# Patient Record
Sex: Female | Born: 1990 | Race: Black or African American | Hispanic: No | Marital: Single | Smoking: Never smoker
Health system: Southern US, Community
[De-identification: ages and names within clinical notes are randomized; demographics above are authoritative.]

---

## 2014-04-05 ENCOUNTER — Emergency Department (HOSPITAL_COMMUNITY)
Admission: EM | Admit: 2014-04-05 | Discharge: 2014-04-05 | Disposition: A | Discharge status: Home / Self Care | Payer: No Typology Code available for payment source | Attending: Emergency Medicine | Admitting: Emergency Medicine

## 2014-04-05 ENCOUNTER — Encounter (HOSPITAL_COMMUNITY): Payer: Self-pay | Admitting: Emergency Medicine

## 2014-04-05 DIAGNOSIS — S39012A Strain of muscle, fascia and tendon of lower back, initial encounter: Secondary | ICD-10-CM

## 2014-04-05 DIAGNOSIS — S335XXA Sprain of ligaments of lumbar spine, initial encounter: Secondary | ICD-10-CM | POA: Insufficient documentation

## 2014-04-05 DIAGNOSIS — Y9389 Activity, other specified: Secondary | ICD-10-CM | POA: Insufficient documentation

## 2014-04-05 DIAGNOSIS — Y92410 Unspecified street and highway as the place of occurrence of the external cause: Secondary | ICD-10-CM | POA: Insufficient documentation

## 2014-04-05 DIAGNOSIS — IMO0002 Reserved for concepts with insufficient information to code with codable children: Secondary | ICD-10-CM | POA: Diagnosis present

## 2014-04-05 MED ORDER — IBUPROFEN 600 MG PO TABS: 600.0000 mg | ORAL_TABLET | Freq: Four times a day (QID) | ORAL | Status: DC | PRN
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
## 2014-04-05 NOTE — Discharge Instructions (Signed)
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
## 2014-04-05 NOTE — ED Provider Notes (Signed)
Medical screening examination/treatment/procedure(s) were performed by non-physician practitioner and as supervising physician I was immediately available for consultation/collaboration.  Hara Milholland L Misheel Gowans, MD 04/05/14 2318 

## 2014-06-11 ENCOUNTER — Encounter (HOSPITAL_COMMUNITY): Payer: Self-pay | Admitting: *Deleted

## 2014-06-11 ENCOUNTER — Emergency Department (HOSPITAL_COMMUNITY): Payer: 59

## 2014-06-11 ENCOUNTER — Emergency Department (HOSPITAL_COMMUNITY)
Admission: EM | Admit: 2014-06-11 | Discharge: 2014-06-11 | Disposition: A | Discharge status: Home / Self Care | Payer: 59 | Attending: Emergency Medicine | Admitting: Emergency Medicine

## 2014-06-11 ENCOUNTER — Emergency Department (HOSPITAL_COMMUNITY): Admission: EM | Admit: 2014-06-11 | Payer: No Typology Code available for payment source | Source: Home / Self Care

## 2014-06-11 DIAGNOSIS — B9789 Other viral agents as the cause of diseases classified elsewhere: Secondary | ICD-10-CM

## 2014-06-11 DIAGNOSIS — R05 Cough: Secondary | ICD-10-CM | POA: Diagnosis present

## 2014-06-11 DIAGNOSIS — R110 Nausea: Secondary | ICD-10-CM | POA: Insufficient documentation

## 2014-06-11 DIAGNOSIS — J069 Acute upper respiratory infection, unspecified: Secondary | ICD-10-CM | POA: Diagnosis not present

## 2014-06-11 DIAGNOSIS — R059 Cough, unspecified: Secondary | ICD-10-CM

## 2014-06-11 MED ORDER — PREDNISONE 20 MG PO TABS: 40.0000 mg | ORAL_TABLET | Freq: Every day | ORAL | Status: AC

## 2014-06-11 MED ORDER — IBUPROFEN 600 MG PO TABS: 600.0000 mg | ORAL_TABLET | Freq: Four times a day (QID) | ORAL | Status: AC | PRN

## 2014-06-11 MED ORDER — BENZONATATE 100 MG PO CAPS: 100.0000 mg | ORAL_CAPSULE | Freq: Three times a day (TID) | ORAL | Status: AC

## 2014-06-11 MED ORDER — FLUTICASONE PROPIONATE 50 MCG/ACT NA SUSP: 2.0000 | Freq: Every day | NASAL | Status: AC

## 2014-06-11 NOTE — ED Notes (Signed)
Pt in c/o cough and congestion for the last several months, recently symptoms have worsened, states she was seen for this a few days ago and dx with bronchitis but they didn't do an xray, pt concerned there is more going on, no distress noted

## 2014-06-11 NOTE — Discharge Instructions (Signed)
Upper Respiratory Infection, Adult °An upper respiratory infection (URI) is also sometimes known as the common cold. The upper respiratory tract includes the nose, sinuses, throat, trachea, and bronchi. Bronchi are the airways leading to the lungs. Most people improve within 1 week, but symptoms can last up to 2 weeks. A residual cough may last even longer.  °CAUSES °Many different viruses can infect the tissues lining the upper respiratory tract. The tissues become irritated and inflamed and often become very moist. Mucus production is also common. A cold is contagious. You can easily spread the virus to others by oral contact. This includes kissing, sharing a glass, coughing, or sneezing. Touching your mouth or nose and then touching a surface, which is then touched by another person, can also spread the virus. °SYMPTOMS  °Symptoms typically develop 1 to 3 days after you come in contact with a cold virus. Symptoms vary from person to person. They may include: °· Runny nose. °· Sneezing. °· Nasal congestion. °· Sinus irritation. °· Sore throat. °· Loss of voice (laryngitis). °· Cough. °· Fatigue. °· Muscle aches. °· Loss of appetite. °· Headache. °· Low-grade fever. °DIAGNOSIS  °You might diagnose your own cold based on familiar symptoms, since most people get a cold 2 to 3 times a year. Your caregiver can confirm this based on your exam. Most importantly, your caregiver can check that your symptoms are not due to another disease such as strep throat, sinusitis, pneumonia, asthma, or epiglottitis. Blood tests, throat tests, and X-rays are not necessary to diagnose a common cold, but they may sometimes be helpful in excluding other more serious diseases. Your caregiver will decide if any further tests are required. °RISKS AND COMPLICATIONS  °You may be at risk for a more severe case of the common cold if you smoke cigarettes, have chronic heart disease (such as heart failure) or lung disease (such as asthma), or if  you have a weakened immune system. The very young and very old are also at risk for more serious infections. Bacterial sinusitis, middle ear infections, and bacterial pneumonia can complicate the common cold. The common cold can worsen asthma and chronic obstructive pulmonary disease (COPD). Sometimes, these complications can require emergency medical care and may be life-threatening. °PREVENTION  °The best way to protect against getting a cold is to practice good hygiene. Avoid oral or hand contact with people with cold symptoms. Wash your hands often if contact occurs. There is no clear evidence that vitamin C, vitamin E, echinacea, or exercise reduces the chance of developing a cold. However, it is always recommended to get plenty of rest and practice good nutrition. °TREATMENT  °Treatment is directed at relieving symptoms. There is no cure. Antibiotics are not effective, because the infection is caused by a virus, not by bacteria. Treatment may include: °· Increased fluid intake. Sports drinks offer valuable electrolytes, sugars, and fluids. °· Breathing heated mist or steam (vaporizer or shower). °· Eating chicken soup or other clear broths, and maintaining good nutrition. °· Getting plenty of rest. °· Using gargles or lozenges for comfort. °· Controlling fevers with ibuprofen or acetaminophen as directed by your caregiver. °· Increasing usage of your inhaler if you have asthma. °Zinc gel and zinc lozenges, taken in the first 24 hours of the common cold, can shorten the duration and lessen the severity of symptoms. Pain medicines may help with fever, muscle aches, and throat pain. A variety of non-prescription medicines are available to treat congestion and runny nose. Your caregiver   can make recommendations and may suggest nasal or lung inhalers for other symptoms.  HOME CARE INSTRUCTIONS   Only take over-the-counter or prescription medicines for pain, discomfort, or fever as directed by your  caregiver.  Use a warm mist humidifier or inhale steam from a shower to increase air moisture. This may keep secretions moist and make it easier to breathe.  Drink enough water and fluids to keep your urine clear or pale yellow.  Rest as needed.  Return to work when your temperature has returned to normal or as your caregiver advises. You may need to stay home longer to avoid infecting others. You can also use a face mask and careful hand washing to prevent spread of the virus. SEEK MEDICAL CARE IF:   After the first few days, you feel you are getting worse rather than better.  You need your caregiver's advice about medicines to control symptoms.  You develop chills, worsening shortness of breath, or brown or red sputum. These may be signs of pneumonia.  You develop yellow or brown nasal discharge or pain in the face, especially when you bend forward. These may be signs of sinusitis.  You develop a fever, swollen neck glands, pain with swallowing, or white areas in the back of your throat. These may be signs of strep throat. SEEK IMMEDIATE MEDICAL CARE IF:   You have a fever.  You develop severe or persistent headache, ear pain, sinus pain, or chest pain.  You develop wheezing, a prolonged cough, cough up blood, or have a change in your usual mucus (if you have chronic lung disease).  You develop sore muscles or a stiff neck. Document Released: 12/22/2000 Document Revised: 09/20/2011 Document Reviewed: 10/03/2013 [facility] Patient Information 2015 [place], [place]. This information is not intended to replace advice given to you by your health care provider. Make sure you discuss any questions you have with your health care provider.  Cough, Adult  A cough is a reflex that helps clear your throat and airways. It can help heal the body or may be a reaction to an irritated airway. A cough may only last 2 or 3 weeks (acute) or may last more than 8 weeks (chronic).  CAUSES Acute  cough:  Viral or bacterial infections. Chronic cough:  Infections.  Allergies.  Asthma.  Post-nasal drip.  Smoking.  Heartburn or acid reflux.  Some medicines.  Chronic lung problems (COPD).  Cancer. SYMPTOMS   Cough.  Fever.  Chest pain.  Increased breathing rate.  High-pitched whistling sound when breathing (wheezing).  Colored mucus that you cough up (sputum). TREATMENT   A bacterial cough may be treated with antibiotic medicine.  A viral cough must run its course and will not respond to antibiotics.  Your caregiver may recommend other treatments if you have a chronic cough. HOME CARE INSTRUCTIONS   Only take over-the-counter or prescription medicines for pain, discomfort, or fever as directed by your caregiver. Use cough suppressants only as directed by your caregiver.  Use a cold steam vaporizer or humidifier in your bedroom or home to help loosen secretions.  Sleep in a semi-upright position if your cough is worse at night.  Rest as needed.  Stop smoking if you smoke. SEEK IMMEDIATE MEDICAL CARE IF:   You have pus in your sputum.  Your cough starts to worsen.  You cannot control your cough with suppressants and are losing sleep.  You begin coughing up blood.  You have difficulty breathing.  You develop pain which is  getting worse or is uncontrolled with medicine.  You have a fever. MAKE SURE YOU:   Understand these instructions.  Will watch your condition.  Will get help right away if you are not doing well or get worse. Document Released: 12/25/2010 Document Revised: 09/20/2011 Document Reviewed: 12/25/2010 [facility] Patient Information 2015 [place]. This information is not intended to replace advice given to you by your health care provider. Make sure you discuss any questions you have with your health care provider. Costochondritis Costochondritis is a condition in which the tissue (cartilage) that connects your ribs with  your breastbone (sternum) becomes irritated. It causes pain in the chest and rib area. It usually goes away on its own over time. HOME CARE  Avoid activities that wear you out.  Do not strain your ribs. Avoid activities that use your:  Chest.  Belly.  Side muscles.  Put ice on the area for the first 2 days after the pain starts.  Put ice in a plastic bag.  Place a towel between your skin and the bag.  Leave the ice on for 20 minutes, 2-3 times a day.  Only take medicine as told by your doctor. GET HELP IF:  You have redness or puffiness (swelling) in the rib area.  Your pain does not go away with rest or medicine. GET HELP RIGHT AWAY IF:   Your pain gets worse.  You are very uncomfortable.  You have trouble breathing.  You cough up blood.  You start sweating or throwing up (vomiting).  You have a fever or lasting symptoms for more than 2-3 days.  You have a fever and your symptoms suddenly get worse. MAKE SURE YOU:   Understand these instructions.  Will watch your condition.  Will get help right away if you are not doing well or get worse. Document Released: 12/15/2007 Document Revised: 02/28/2013 Document Reviewed: 01/30/2013 [facility] Patient Information 2015 [place]. This information is not intended to replace advice given to you by your health care provider. Make sure you discuss any questions you have with your health care provider.

## 2014-06-11 NOTE — ED Provider Notes (Signed)
CSN: [id number]     Arrival date & time 06/11/14  0038 History   First MD Initiated Contact with Patient 06/11/14 0204     Chief Complaint  Patient presents with  . Cough    (Consider location/radiation/quality/duration/timing/severity/associated sxs/prior Treatment) HPI Comments: 23 year old female with no significant past medical history presents to the emergency department for further evaluation of cough and congestion. Patient states that her symptoms have been worsening over the past 5 days. She states that her cough is dry. [place] of [place] symptoms and prescribed Levaquin. She states that she has been taking this for 2 days without relief of symptoms. She denies trying any other medications prior to arrival. She notes associated postnasal drip, rhinorrhea, and nausea. No known sick contacts. She is concerned that there is something that was missed at her previous evaluation because they never performed a chest x-ray.  Patient is a 23 y.o. female presenting with cough. The history is provided by the patient. No language interpreter was used.  Cough Associated symptoms: rhinorrhea   Associated symptoms: no fever and no shortness of breath     History reviewed. No pertinent past medical history. History reviewed. No pertinent past surgical history. History reviewed. No pertinent family history. History  Substance Use Topics  . Smoking status: Never Smoker   . Smokeless tobacco: Not on file  . Alcohol Use: No   OB History    No data available      Review of Systems  Constitutional: Negative for fever.  HENT: Positive for congestion, postnasal drip, rhinorrhea and sinus pressure.   Respiratory: Positive for cough. Negative for shortness of breath.   Gastrointestinal: Negative for vomiting and diarrhea.  All other systems reviewed and are negative.   Allergies  Review of patient's allergies indicates no known allergies.  Home  Medications   Prior to Admission medications   Medication Sig Start Date End Date Taking? Authorizing Provider  benzonatate (TESSALON) 100 MG capsule Take 1 capsule (100 mg total) by mouth every 8 (eight) hours. 06/11/14   Antony MaduraKelly Levester Waldridge, PA-C  fluticasone (FLONASE) 50 MCG/ACT nasal spray Place 2 sprays into both nostrils daily. 06/11/14   Antony MaduraKelly Jobina Maita, PA-C  ibuprofen (ADVIL,MOTRIN) 600 MG tablet Take 1 tablet (600 mg total) by mouth every 6 (six) hours as needed. 06/11/14   Antony MaduraKelly Alexsia Klindt, PA-C  predniSONE (DELTASONE) 20 MG tablet Take 2 tablets (40 mg total) by mouth daily. 06/11/14   Antony MaduraKelly Dhanya Bogle, PA-C   BP 97/60 mmHg  Pulse 70  Temp(Src) 98 F (36.7 C) (Oral)  Resp 18  SpO2 99%  LMP 06/07/2014   Physical Exam  Constitutional: She is oriented to person, place, and time. She appears well-developed and well-nourished. No distress.  Nontoxic/nonseptic appearing  HENT:  Head: Normocephalic and atraumatic.  Right Ear: Hearing, tympanic membrane, external ear and ear canal normal.  Left Ear: Hearing, tympanic membrane, external ear and ear canal normal.  Nose: Right sinus exhibits no maxillary sinus tenderness and no frontal sinus tenderness. Left sinus exhibits no maxillary sinus tenderness and no frontal sinus tenderness.  Mouth/Throat: Uvula is midline, oropharynx is clear and moist and mucous membranes are normal.  Audible nasal congestion. Bilateral nares patent. Patient with copious amounts of nasal discharge; blowing her nose at bedside. Oropharynx clear. Uvula midline.  Eyes: Conjunctivae and EOM are normal. Pupils are equal, round, and reactive to light. No scleral icterus.  Neck: Normal range of motion. Neck supple.  Cardiovascular: Normal rate, regular rhythm and normal heart sounds.   Pulmonary/Chest: Effort normal. No respiratory distress. She has no wheezes. She has no rales.  Respirations even and unlabored. Lungs clear.  Abdominal: Soft. She exhibits no distension. There is no  tenderness.  Soft, nontender. No masses.  Musculoskeletal: Normal range of motion.  Neurological: She is alert and oriented to person, place, and time. She exhibits normal muscle tone. Coordination normal.  Skin: Skin is warm and dry. No rash noted. She is not diaphoretic. No erythema. No pallor.  Psychiatric: She has a normal mood and affect. Her behavior is normal.  Nursing note and vitals reviewed.   ED Course  Procedures (including critical care time) Labs Review Labs Reviewed - No data to display  Imaging Review Dg Chest 2 View  06/11/2014   CLINICAL DATA:  Acute onset of chest pain and soreness. Chronic productive cough for several months. Initial encounter.  EXAM: CHEST  2 VIEW  COMPARISON:  None.  FINDINGS: The lungs are well-aerated and clear. There is no evidence of focal opacification, pleural effusion or pneumothorax.  The heart is normal in size; the mediastinal contour is within normal limits. No acute osseous abnormalities are seen.  IMPRESSION: No acute cardiopulmonary process seen.   Electronically Signed   By: Roanna RaiderJeffery  Chang M.D.   On: 06/11/2014 01:33     EKG Interpretation None      MDM   Final diagnoses:  Viral URI with cough    23 year old female presents to the emergency department for further evaluation of cough, nasal congestion, rhinorrhea, sinus pressure, and postnasal drip. Patient on Levaquin 2 days without improvement in her symptoms. Physical exam as above. Patient well and nontoxic appearing, hemodynamically stable, and afebrile. CXR negative for acute cardiopulmonary process. Symptoms consistent with viral upper respiratory infection/viral sinusitis with cough. Nausea likely secondary to degree of postnasal drip. Have counseled patient on supportive treatment and provided return precautions. She is stable for discharge at this time with no unaddressed concerns.   Filed Vitals:   06/11/14 0233  BP: 97/60  Pulse: 70  Temp: 98 F (36.7 C)  Resp:  [id number]      Teandre Hamre, PA-C 06/11/14 [id number]  Dione Boozeavid Glick, MD 06/11/14 [phone]

## 2014-09-08 ENCOUNTER — Encounter (HOSPITAL_COMMUNITY): Payer: Self-pay | Admitting: Physical Medicine and Rehabilitation

## 2014-09-08 ENCOUNTER — Emergency Department (HOSPITAL_COMMUNITY)
Admission: EM | Admit: 2014-09-08 | Discharge: 2014-09-08 | Disposition: A | Discharge status: Home / Self Care | Payer: 59 | Attending: Emergency Medicine | Admitting: Emergency Medicine

## 2014-09-08 DIAGNOSIS — Z3202 Encounter for pregnancy test, result negative: Secondary | ICD-10-CM | POA: Diagnosis not present

## 2014-09-08 DIAGNOSIS — R197 Diarrhea, unspecified: Secondary | ICD-10-CM | POA: Insufficient documentation

## 2014-09-08 DIAGNOSIS — E860 Dehydration: Secondary | ICD-10-CM | POA: Insufficient documentation

## 2014-09-08 DIAGNOSIS — Z79899 Other long term (current) drug therapy: Secondary | ICD-10-CM | POA: Insufficient documentation

## 2014-09-08 DIAGNOSIS — Z7952 Long term (current) use of systemic steroids: Secondary | ICD-10-CM | POA: Insufficient documentation

## 2014-09-08 LAB — CBC WITH DIFFERENTIAL/PLATELET
BASOS ABS: 0 10*3/uL (ref 0.0–0.1)
BASOS PCT: 0 % (ref 0–1)
EOS ABS: 0.1 10*3/uL (ref 0.0–0.7)
Eosinophils Relative: 1 % (ref 0–5)
HCT: 43.6 % (ref 36.0–46.0)
Hemoglobin: 14.7 g/dL (ref 12.0–15.0)
LYMPHS PCT: 24 % (ref 12–46)
Lymphs Abs: 1.5 10*3/uL (ref 0.7–4.0)
MCH: 30.2 pg (ref 26.0–34.0)
MCHC: 33.7 g/dL (ref 30.0–36.0)
MCV: 89.5 fL (ref 78.0–100.0)
MONO ABS: 0.5 10*3/uL (ref 0.1–1.0)
Monocytes Relative: 9 % (ref 3–12)
Neutro Abs: 4.1 10*3/uL (ref 1.7–7.7)
Neutrophils Relative %: 66 % (ref 43–77)
Platelets: 281 10*3/uL (ref 150–400)
RBC: 4.87 MIL/uL (ref 3.87–5.11)
RDW: 12 % (ref 11.5–15.5)
WBC: 6.2 10*3/uL (ref 4.0–10.5)

## 2014-09-08 LAB — URINALYSIS, ROUTINE W REFLEX MICROSCOPIC
Bilirubin Urine: NEGATIVE
Glucose, UA: NEGATIVE mg/dL
Hgb urine dipstick: NEGATIVE
Ketones, ur: 15 mg/dL — AB
Leukocytes, UA: NEGATIVE
NITRITE: NEGATIVE
PH: 5.5 (ref 5.0–8.0)
Protein, ur: NEGATIVE mg/dL
Specific Gravity, Urine: 1.018 (ref 1.005–1.030)
UROBILINOGEN UA: 0.2 mg/dL (ref 0.0–1.0)

## 2014-09-08 LAB — COMPREHENSIVE METABOLIC PANEL
ALBUMIN: 4.5 g/dL (ref 3.5–5.2)
ALT: 15 U/L (ref 0–35)
ANION GAP: 6 (ref 5–15)
AST: 18 U/L (ref 0–37)
Alkaline Phosphatase: 49 U/L (ref 39–117)
BUN: <5 mg/dL — ABNORMAL LOW (ref 6–23)
CO2: 28 mmol/L (ref 19–32)
CREATININE: 0.96 mg/dL (ref 0.50–1.10)
Calcium: 9.6 mg/dL (ref 8.4–10.5)
Chloride: 102 mmol/L (ref 96–112)
GFR calc Af Amer: >90 mL/min (ref >90)
GFR, EST NON AFRICAN AMERICAN: 84 mL/min — AB (ref >90)
GLUCOSE: 84 mg/dL (ref 70–99)
Potassium: 3.8 mmol/L (ref 3.5–5.1)
Sodium: 136 mmol/L (ref 135–145)
Total Bilirubin: 2.4 mg/dL — ABNORMAL HIGH (ref 0.3–1.2)
Total Protein: 7.7 g/dL (ref 6.0–8.3)

## 2014-09-08 LAB — POC URINE PREG, ED: Preg Test, Ur: NEGATIVE

## 2014-09-08 LAB — LIPASE, BLOOD: LIPASE: 24 U/L (ref 11–59)

## 2014-09-08 MED ORDER — ONDANSETRON HCL 4 MG/2ML IJ SOLN
4.0000 mg | Freq: Once | INTRAMUSCULAR | Status: AC
  Administered 2014-09-08: 4 mg via INTRAVENOUS
  Filled 2014-09-08: qty 2

## 2014-09-08 MED ORDER — ONDANSETRON 8 MG PO TBDP: 8.0000 mg | ORAL_TABLET | Freq: Three times a day (TID) | ORAL | Status: AC | PRN

## 2014-09-08 NOTE — Discharge Instructions (Signed)

## 2014-09-08 NOTE — ED Provider Notes (Signed)
CSN: [id number]     Arrival date & time 09/08/14  1709 History   First MD Initiated Contact with Patient 09/08/14 1915     Chief Complaint  Patient presents with  . Abdominal Pain  . Diarrhea      HPI Patient presents to the emergency department complaining of watery diarrhea over the past 2 weeks.  She points nausea without vomiting.  She denies blood in her stool.  She denies recent travel or sick contacts.  No recent antibiotics.  She reports some lower abdominal crampy type pain.  She denies dysuria or urinary frequency.  No back pain.  Denies upper abdominal pain.  Symptoms are mild in severity.  Nothing worsens or improves her symptoms.  She felt slightly weak today which is why she presented.   History reviewed. No pertinent past medical history. History reviewed. No pertinent past surgical history. No family history on file. History  Substance Use Topics  . Smoking status: Never Smoker   . Smokeless tobacco: Not on file  . Alcohol Use: No   OB History    No data available     Review of Systems  All other systems reviewed and are negative.     Allergies  Review of patient's allergies indicates no known allergies.  Home Medications   Prior to Admission medications   Medication Sig Start Date End Date Taking? Authorizing Provider  Bismuth Subsalicylate (KAOPECTATE PO) Take 2 tablets by mouth 2 (two) times daily as needed (diarrhea).   Yes Historical Provider, MD  loperamide (IMODIUM A-D) 2 MG tablet Take 2 mg by mouth 5 (five) times daily as needed for diarrhea or loose stools.   Yes Historical Provider, MD  benzonatate (TESSALON) 100 MG capsule Take 1 capsule (100 mg total) by mouth every 8 (eight) hours. Patient not taking: Reported on 09/08/2014 06/11/14   Antony MaduraKelly Humes, PA-C  fluticasone [facility](FLONASE) 50 MCG/ACT nasal spray Place 2 sprays into both nostrils daily. Patient not taking: Reported on 09/08/2014 06/11/14   Antony MaduraKelly Humes, PA-C  ibuprofen (ADVIL,MOTRIN) 600 MG  tablet Take 1 tablet (600 mg total) by mouth every 6 (six) hours as needed. Patient not taking: Reported on 09/08/2014 06/11/14   Antony MaduraKelly Humes, PA-C  ondansetron (ZOFRAN ODT) 8 MG disintegrating tablet Take 1 tablet (8 mg total) by mouth every 8 (eight) hours as needed for nausea or vomiting. 09/08/14   Lyanne CoKevin M Kyrianna Barletta, MD  predniSONE (DELTASONE) 20 MG tablet Take 2 tablets (40 mg total) by mouth daily. Patient not taking: Reported on 09/08/2014 06/11/14   Antony MaduraKelly Humes, PA-C   BP 115/61 mmHg  Pulse 58  Temp(Src) 98.8 F (37.1 C)  Resp 16  Ht 5\' 1"  (1.549 m)  Wt 117 lb (53.071 kg)  BMI 22.12 kg/m2  SpO2 100% Physical Exam  Constitutional: She is oriented to person, place, and time. She appears well-developed and well-nourished. No distress.  HENT:  Head: Normocephalic and atraumatic.  Eyes: EOM are normal.  Neck: Normal range of motion.  Cardiovascular: Normal rate, regular rhythm and normal heart sounds.   Pulmonary/Chest: Effort normal and breath sounds normal.  Abdominal: Soft. She exhibits no distension. There is no tenderness.  Musculoskeletal: Normal range of motion.  Neurological: She is alert and oriented to person, place, and time.  Skin: Skin is warm and dry.  Psychiatric: She has a normal mood and affect. Judgment normal.  Nursing note and vitals reviewed.   ED Course  Procedures (including critical care time) Labs Review Labs Reviewed  COMPREHENSIVE METABOLIC PANEL - Abnormal; Notable for the following:    BUN <5 (*)    Total Bilirubin 2.4 (*)    GFR calc non Af Amer 84 (*)    All other components within normal limits  URINALYSIS, ROUTINE W REFLEX MICROSCOPIC - Abnormal; Notable for the following:    Ketones, ur 15 (*)    All other components within normal limits  CLOSTRIDIUM DIFFICILE BY PCR  STOOL CULTURE  OVA AND PARASITE EXAMINATION  CBC WITH DIFFERENTIAL/PLATELET  LIPASE, BLOOD  POC URINE PREG, ED    Imaging Review No results found.   EKG  Interpretation None      MDM   Final diagnoses:  Diarrhea  Dehydration    Patient feels better after IV fluids.  Suspect volume depletion.  C. difficile, stool culture, ova and parasites sent.  Outpatient GI follow-up.  Patient understands to return to the ER for new or worsening symptoms.    Lyanne Co, MD 09/08/14 2200

## 2014-09-08 NOTE — ED Notes (Signed)
Pt placed in a gown and hooked up to monitor with bp cuff pulse ox

## 2014-09-08 NOTE — ED Notes (Signed)
MD at bedside. 

## 2014-09-08 NOTE — ED Notes (Signed)
Pt presents to department for evaluation of diffuse abdominal pain and diarrhea, ongoing x2 weeks. 5/10 pain upon arrival to ED. Denies vaginal symptoms. Denies urinary symptoms. Pt is alert and oriented x4.

## 2014-09-09 ENCOUNTER — Telehealth (HOSPITAL_BASED_OUTPATIENT_CLINIC_OR_DEPARTMENT_OTHER): Payer: Self-pay | Admitting: Emergency Medicine

## 2014-09-09 LAB — CLOSTRIDIUM DIFFICILE BY PCR: Toxigenic C. Difficile by PCR: POSITIVE — AB

## 2014-09-10 LAB — OVA AND PARASITE EXAMINATION: Ova and parasites: NONE SEEN

## 2014-09-12 LAB — STOOL CULTURE

## 2014-10-29 ENCOUNTER — Encounter (HOSPITAL_COMMUNITY): Payer: Self-pay | Admitting: *Deleted

## 2014-10-29 ENCOUNTER — Emergency Department (HOSPITAL_COMMUNITY)
Admission: EM | Admit: 2014-10-29 | Discharge: 2014-10-29 | Disposition: A | Discharge status: Home / Self Care | Payer: 59 | Attending: Emergency Medicine | Admitting: Emergency Medicine

## 2014-10-29 DIAGNOSIS — Z7951 Long term (current) use of inhaled steroids: Secondary | ICD-10-CM | POA: Diagnosis not present

## 2014-10-29 DIAGNOSIS — Z79899 Other long term (current) drug therapy: Secondary | ICD-10-CM | POA: Diagnosis not present

## 2014-10-29 DIAGNOSIS — R45851 Suicidal ideations: Secondary | ICD-10-CM | POA: Diagnosis present

## 2014-10-29 DIAGNOSIS — F411 Generalized anxiety disorder: Secondary | ICD-10-CM | POA: Insufficient documentation

## 2014-10-29 DIAGNOSIS — F39 Unspecified mood [affective] disorder: Secondary | ICD-10-CM | POA: Diagnosis not present

## 2014-10-29 DIAGNOSIS — F419 Anxiety disorder, unspecified: Secondary | ICD-10-CM | POA: Diagnosis not present

## 2014-10-29 DIAGNOSIS — Z7952 Long term (current) use of systemic steroids: Secondary | ICD-10-CM | POA: Insufficient documentation

## 2014-10-29 DIAGNOSIS — F430 Acute stress reaction: Secondary | ICD-10-CM

## 2014-10-29 LAB — ETHANOL: Alcohol, Ethyl (B): <5 mg/dL (ref 0–9)

## 2014-10-29 LAB — COMPREHENSIVE METABOLIC PANEL
ALK PHOS: 46 U/L (ref 39–117)
ALT: 11 U/L (ref 0–35)
AST: 17 U/L (ref 0–37)
Albumin: 5.1 g/dL (ref 3.5–5.2)
Anion gap: 8 (ref 5–15)
BUN: 11 mg/dL (ref 6–23)
CALCIUM: 9.4 mg/dL (ref 8.4–10.5)
CHLORIDE: 104 mmol/L (ref 96–112)
CO2: 29 mmol/L (ref 19–32)
Creatinine, Ser: 0.85 mg/dL (ref 0.50–1.10)
GFR calc non Af Amer: >90 mL/min (ref >90)
GLUCOSE: 85 mg/dL (ref 70–99)
POTASSIUM: 3.1 mmol/L — AB (ref 3.5–5.1)
SODIUM: 141 mmol/L (ref 135–145)
Total Bilirubin: 1.4 mg/dL — ABNORMAL HIGH (ref 0.3–1.2)
Total Protein: 8.2 g/dL (ref 6.0–8.3)

## 2014-10-29 LAB — ACETAMINOPHEN LEVEL: Acetaminophen (Tylenol), Serum: <10 ug/mL — ABNORMAL LOW (ref 10–30)

## 2014-10-29 LAB — CBC
HEMATOCRIT: 43.8 % (ref 36.0–46.0)
HEMOGLOBIN: 14.4 g/dL (ref 12.0–15.0)
MCH: 30.3 pg (ref 26.0–34.0)
MCHC: 32.9 g/dL (ref 30.0–36.0)
MCV: 92 fL (ref 78.0–100.0)
Platelets: 316 10*3/uL (ref 150–400)
RBC: 4.76 MIL/uL (ref 3.87–5.11)
RDW: 11.8 % (ref 11.5–15.5)
WBC: 6.4 10*3/uL (ref 4.0–10.5)

## 2014-10-29 LAB — RAPID URINE DRUG SCREEN, HOSP PERFORMED
AMPHETAMINES: NOT DETECTED
Barbiturates: NOT DETECTED
Benzodiazepines: NOT DETECTED
Cocaine: NOT DETECTED
Opiates: NOT DETECTED
Tetrahydrocannabinol: NOT DETECTED

## 2014-10-29 LAB — SALICYLATE LEVEL: Salicylate Lvl: <4 mg/dL (ref 2.8–20.0)

## 2014-10-29 MED ORDER — ALUM & MAG HYDROXIDE-SIMETH 200-200-20 MG/5ML PO SUSP: 30.0000 mL | ORAL | Status: DC | PRN

## 2014-10-29 MED ORDER — IBUPROFEN 200 MG PO TABS: 600.0000 mg | ORAL_TABLET | Freq: Three times a day (TID) | ORAL | Status: DC | PRN

## 2014-10-29 MED ORDER — ACETAMINOPHEN 325 MG PO TABS: 650.0000 mg | ORAL_TABLET | ORAL | Status: DC | PRN

## 2014-10-29 MED ORDER — ZOLPIDEM TARTRATE 5 MG PO TABS: 5.0000 mg | ORAL_TABLET | Freq: Every evening | ORAL | Status: DC | PRN

## 2014-10-29 MED ORDER — NICOTINE 21 MG/24HR TD PT24: 21.0000 mg | MEDICATED_PATCH | Freq: Every day | TRANSDERMAL | Status: DC

## 2014-10-29 MED ORDER — ONDANSETRON HCL 4 MG PO TABS: 4.0000 mg | ORAL_TABLET | Freq: Three times a day (TID) | ORAL | Status: DC | PRN

## 2014-10-29 NOTE — ED Notes (Signed)
Acuity low. 

## 2014-10-29 NOTE — ED Notes (Signed)
[facility] Expected date:  Expected time:  Means of arrival:  Comments: Triage 3 go back at 1800

## 2014-10-29 NOTE — ED Provider Notes (Signed)
CSN: [id number]     Arrival date & time 10/29/14  1643 History   First MD Initiated Contact with Patient 10/29/14 1705     Chief Complaint  Patient presents with  . Suicidal     (Consider location/radiation/quality/duration/timing/severity/associated sxs/prior Treatment) HPI Mallory Johnson is a 24 year old female with no past medical history who presents the ER after being sent by [facility] counseling services for suicidal ideation. She reports having multiple unusual stressors in her life for the past 2 weeks, and on Saturday had thoughts of jumping off of a hotel. Patient states 2 days ago she also had thoughts of walking out into traffic to harm herself. Patient reports some generalized feelings of depression, however states she has had no mental health issues in the past, and does not have any ongoing problems with depression. Patient denies having any medical complaints. Denies homicidal ideation.  History reviewed. No pertinent past medical history. History reviewed. No pertinent past surgical history. History reviewed. No pertinent family history. History  Substance Use Topics  . Smoking status: Never Smoker   . Smokeless tobacco: Not on file  . Alcohol Use: No   OB History    No data available     Review of Systems  Constitutional: Negative for fever.  HENT: Negative for trouble swallowing.   Eyes: Negative for visual disturbance.  Respiratory: Negative for shortness of breath.   Cardiovascular: Negative for chest pain.  Gastrointestinal: Negative for nausea, vomiting and abdominal pain.  Genitourinary: Negative for dysuria.  Musculoskeletal: Negative for neck pain.  Skin: Negative for rash.  Neurological: Negative for dizziness, weakness and numbness.  Psychiatric/Behavioral: Positive for suicidal ideas.      Allergies  Review of patient's allergies indicates no known allergies.  Home Medications   Prior to Admission medications   Medication Sig Start Date End  Date Taking? Authorizing Provider  BIOTIN PO Take by mouth daily.   Yes Historical Provider, MD  benzonatate (TESSALON) 100 MG capsule Take 1 capsule (100 mg total) by mouth every 8 (eight) hours. Patient not taking: Reported on 09/08/2014 06/11/14   Antony Madura, PA-C  Bismuth Subsalicylate (KAOPECTATE PO) Take 2 tablets by mouth 2 (two) times daily as needed (diarrhea).    Historical Provider, MD  fluticasone (FLONASE) 50 MCG/ACT nasal spray Place 2 sprays into both nostrils daily. Patient not taking: Reported on 09/08/2014 06/11/14   Antony Madura, PA-C  ibuprofen (ADVIL,MOTRIN) 600 MG tablet Take 1 tablet (600 mg total) by mouth every 6 (six) hours as needed. Patient not taking: Reported on 09/08/2014 06/11/14   Antony Madura, PA-C  loperamide (IMODIUM A-D) 2 MG tablet Take 2 mg by mouth 5 (five) times daily as needed for diarrhea or loose stools.    Historical Provider, MD  ondansetron (ZOFRAN ODT) 8 MG disintegrating tablet Take 1 tablet (8 mg total) by mouth every 8 (eight) hours as needed for nausea or vomiting. Patient not taking: Reported on 10/29/2014 09/08/14   Azalia Bilis, MD  predniSONE (DELTASONE) 20 MG tablet Take 2 tablets (40 mg total) by mouth daily. Patient not taking: Reported on 09/08/2014 06/11/14   Antony Madura, PA-C   BP 113/82 mmHg  Pulse 67  Temp(Src) 97.7 F (36.5 C) (Oral)  Resp 18  SpO2 99% Physical Exam  Constitutional: She is oriented to person, place, and time. She appears well-developed and well-nourished. No distress.  HENT:  Head: Normocephalic and atraumatic.  Mouth/Throat: Oropharynx is clear and moist. No oropharyngeal exudate.  Eyes: Right eye exhibits  no discharge. Left eye exhibits no discharge. No scleral icterus.  Neck: Normal range of motion.  Cardiovascular: Normal rate, regular rhythm and normal heart sounds.   No murmur heard. Pulmonary/Chest: Effort normal and breath sounds normal. No respiratory distress.  Abdominal: Soft. There is no tenderness.   Musculoskeletal: Normal range of motion. She exhibits no edema or tenderness.  Neurological: She is alert and oriented to person, place, and time. No cranial nerve deficit. Coordination normal.  Skin: Skin is warm and dry. No rash noted. She is not diaphoretic.  Psychiatric:  Patient's mood is slightly depressed with affect appropriate to mood. Speech is normal and communicative. Behavior is normal and appropriate. Patient does not appear to be responding to any internal stimuli. Thought content does endorse some suicidal ideation with specific plans several days ago. Judgment and insight are impaired.  Nursing note and vitals reviewed.   ED Course  Procedures (including critical care time) Labs Review Labs Reviewed  ACETAMINOPHEN LEVEL - Abnormal; Notable for the following:    Acetaminophen (Tylenol), Serum <10.0 (*)    All other components within normal limits  COMPREHENSIVE METABOLIC PANEL - Abnormal; Notable for the following:    Potassium 3.1 (*)    Total Bilirubin 1.4 (*)    All other components within normal limits  CBC  ETHANOL  SALICYLATE LEVEL  URINE RAPID DRUG SCREEN (HOSP PERFORMED)    Imaging Review No results found.   EKG Interpretation None      MDM   Final diagnoses:  Passive suicidal ideations   Patient here describing some passive suicidal ideation, [facility] be evaluated by behavioral health here. Patient denying any medical complaints, labs appear to be without any evidence of acute pathology. Patient well-appearing and hemodynamically stable on exam. do not see any evidence of organic etiology of patient's psychiatric complaints. We will consult TTS for psych evaluation.  Patient seen and evaluated by TTS, after psych evaluation, psychiatric services report patient is stable for discharge to follow up with outpatient resources. They state she does not meet inpatient criteria at this time. Patient to be  discharged, and strongly recommended to follow-up with outpatient resources. Return precautions discussed, patient contracted for safety, and verbalizes understanding and agreement of this. Encouraged patient call or return to ER should she have any questions or concerns.  BP 113/82 mmHg  Pulse 67  Temp(Src) 97.7 F (36.5 C) (Oral)  Resp 18  SpO2 99%  Signed,  Ladona MowJoe Zackari Ruane, PA-C 2:11 AM     Ladona MowJoe Brylee Berk, PA-C 10/30/14 [id number]  Donnetta HutchingBrian Cook, MD 10/30/14 [id number]

## 2014-10-29 NOTE — Progress Notes (Signed)
Writer to fax OPT resources for pt at d/c, per extender [place].   [place] Abtsatia Rhonna Holster, LCSWA Disposition staff 10/29/2014 9:11 PM

## 2014-10-29 NOTE — Discharge Instructions (Signed)
Depression °Depression refers to feeling sad, low, down in the dumps, blue, gloomy, or empty. In general, there are two kinds of depression: °· Normal sadness or normal grief. This kind of depression is one that we all feel from time to time after upsetting life experiences, such as the loss of a job or the ending of a relationship. This kind of depression is considered normal, is short lived, and resolves within a few days to 2 weeks. Depression experienced after the loss of a loved one (bereavement) often lasts longer than 2 weeks but normally gets better with time. °· Clinical depression. This kind of depression lasts longer than normal sadness or normal grief or interferes with your ability to function at home, at work, and in school. It also interferes with your personal relationships. It affects almost every aspect of your life. Clinical depression is an illness. °Symptoms of depression can also be caused by conditions other than those mentioned above, such as: °· Physical illness. Some physical illnesses, including underactive thyroid gland (hypothyroidism), severe anemia, specific types of cancer, diabetes, uncontrolled seizures, heart and lung problems, strokes, and chronic pain are commonly associated with symptoms of depression. °· Side effects of some prescription medicine. In some people, certain types of medicine can cause symptoms of depression. °· Substance abuse. Abuse of alcohol and illicit drugs can cause symptoms of depression. °SYMPTOMS °Symptoms of normal sadness and normal grief include the following: °· Feeling sad or crying for short periods of time. °· Not caring about anything (apathy). °· Difficulty sleeping or sleeping too much. °· No longer able to enjoy the things you used to enjoy. °· Desire to be by oneself all the time (social isolation). °· Lack of energy or motivation. °· Difficulty concentrating or remembering. °· Change in appetite or weight. °· Restlessness or  agitation. °Symptoms of clinical depression include the same symptoms of normal sadness or normal grief and also the following symptoms: °· Feeling sad or crying all the time. °· Feelings of guilt or worthlessness. °· Feelings of hopelessness or helplessness. °· Thoughts of suicide or the desire to harm yourself (suicidal ideation). °· Loss of touch with reality (psychotic symptoms). Seeing or hearing things that are not real (hallucinations) or having false beliefs about your life or the people around you (delusions and paranoia). °DIAGNOSIS  °The diagnosis of clinical depression is usually based on how bad the symptoms are and how long they have lasted. Your health care provider will also ask you questions about your medical history and substance use to find out if physical illness, use of prescription medicine, or substance abuse is causing your depression. Your health care provider may also order blood tests. °TREATMENT  °Often, normal sadness and normal grief do not require treatment. However, sometimes antidepressant medicine is given for bereavement to ease the depressive symptoms until they resolve. °The treatment for clinical depression depends on how bad the symptoms are but often includes antidepressant medicine, counseling with a mental health professional, or both. Your health care provider will help to determine what treatment is best for you. °Depression caused by physical illness usually goes away with appropriate medical treatment of the illness. If prescription medicine is causing depression, talk with your health care provider about stopping the medicine, decreasing the dose, or changing to another medicine. °Depression caused by the abuse of alcohol or illicit drugs goes away when you stop using these substances. Some adults need professional help in order to stop drinking or using drugs. °SEEK IMMEDIATE MEDICAL   CARE IF:  You have thoughts about hurting yourself or others.  You lose touch  with reality (have psychotic symptoms).  You are taking medicine for depression and have a serious side effect. FOR MORE INFORMATION  National Alliance on Mental Illness: www.nami.[employer]org  National Institute of Mental Health: http://www.maynard.net/www.nimh.nih.gov Document Released: 06/25/2000 Document Revised: 11/12/2013 Document Reviewed: 09/27/2011 [facility] Patient Information 2015 [place], [place]. This information is not intended to replace advice given to you by your health care provider. Make sure you discuss any questions you have with your health care provider.   No-harm Safety Contract  A no-harm safety contract is a written or verbal agreement between you and a mental health professional to promote safety. It contains specific actions and promises you agree to. The agreement also includes instructions from the therapist or doctor. The instructions will help prevent you from harming yourself or harming others. Harm can be as mild as pinching yourself, but can increase in intensity to actions like burning or cutting yourself. The extreme level of self-harm would be committing suicide. No-harm safety contracts are also sometimes referred to as a Charity fundraiserno-suicide contract, suicide Financial controllerprevention contract, no-harm agreements or decisions, or a Engineer, manufacturing systemssafety contract.  REASONS FOR NO-HARM SAFETY CONTRACTS Safety contracts are just one part of an overall treatment plan to help keep you safe and free of harm. A safety contract may help to relieve anxiety, restore a sense of control, state clearly the alternatives to harm or suicide, and give you and your therapist or doctor a gauge for how you are doing in between visits. Many factors impact the decision to use a no-harm safety contract and its effectiveness. A proper overall treatment plan and evaluation and good patient understanding are the keys to good outcomes. CONTRACT ELEMENTS  A contract can range from simple to complex. They include all or some of the following:  Action  statements. These are statements you agree to do or not do. Example: If I feel my life is becoming too difficult, I agree to do the following so there is no harm to myself or others:  Talk with family or friends.  Rid myself of all things that I could use to harm myself.  Do an activity I enjoy or have enjoyed in the recent past. Coping strategies. These are ways to think and feel that decrease stress, such as:  Use of affirmations or positive statements about self.  Good self-care, including improved grooming, and healthy eating, and healthy sleeping patterns.  Increase physical exercise.  Increase social involvement.  Focus on positive aspects of life. Crisis management. This would include what to do if there was trouble following the contract or an urge to harm. This might include notifying family or your therapist of suicidal thoughts. Be open and honest about suicidal urges. To prevent a crisis, do the following:  List reasons to reach out for support.  Keep contact numbers and available hours handy. Treatment goals. These are goals would include no suicidal thoughts, improved mood, and feelings of hopefulness. Listed responsibilities of different people involved in care. This could include family members. A family member may agree to remove firearms or other lethal weapons/substances from your ease of access. A timeline. A timeline can be in place from one therapy session to the next session. HOME CARE INSTRUCTIONS   Follow your no-harm safety contract.  Contact your therapist and/or doctor if you have any questions or concerns. MAKE SURE YOU:   Understand these instructions.  Will watch your condition.  Noticing any mood changes or suicidal urges.  Will get help right away if you are not doing well or get worse. Document Released: 12/16/2009 Document Revised: 09/20/2011 Document Reviewed: 12/16/2009 [facility] Patient Information 2015 [place], [place]. This information is  not intended to replace advice given to you by your health care provider. Make sure you discuss any questions you have with your health care provider.   Emergency Department Resource Guide 1) Find a Doctor and Pay Out of Pocket Although you won't have to find out who is covered by your insurance plan, it is a good idea to ask around and get recommendations. You will then need to call the office and see if the doctor you have chosen will accept you as a new patient and what types of options they offer for patients who are self-pay. Some doctors offer discounts or will set up payment plans for their patients who do not have insurance, but you will need to ask so you aren't surprised when you get to your appointment.  2) Contact Your Local Health Department Not all health departments have doctors that can see patients for sick visits, but many do, so it is worth a call to see if yours does. If you don't know where your local health department is, you can check in your phone book. The CDC also has a tool to help you locate your state's health department, and many state websites also have listings of all of their local health departments.  3) Find a Walk-in Clinic If your illness is not likely to be very severe or complicated, you may want to try a walk in clinic. These are popping up all over the country in pharmacies, drugstores, and shopping centers. They're usually staffed by nurse practitioners or physician assistants that have been trained to treat common illnesses and complaints. They're usually fairly quick and inexpensive. However, if you have serious medical issues or chronic medical problems, these are probably not your best option.  No Primary Care Doctor: - Call Health Connect at  [phone] - they can help you locate a primary care doctor that  accepts your insurance, provides certain services, etc. - Physician Referral Service- [phone]  Chronic Pain Problems: Organization          Address  Phone   Notes  [facility]  [phone] Patients need to be referred by their primary care doctor.   Medication Assistance: Organization         Address  Phone   Notes  [facility] Medication [facility] [address] [place], [place] [address] [phone] --Must be a resident of [facility] -- Must have NO insurance coverage whatsoever (no Medicaid/ Medicare, etc.) -- The pt. MUST have a primary care doctor that directs their care regularly and follows them in the community   [facility]ist  [phone]   [employer]  [phone]    Agencies that provide inexpensive medical care: Organization         Address  Phone   Notes  [facility]  [phone]   [facility]    [phone]   [facility] [address] [place], [place] [address] [phone]   [facility] [address] [place] [place] [phone]   [facility]anned Parenthood    [phone]   [facility]    [phone]   [facility]  [phone]  [address], [place] Phone:  [phone], Fax:  [phone] Hours of Operation:  9 am - 6 pm, M-F.  Also accepts Medicaid/Medicare and self-pay.  [facility]  [address], [place] Phone: [phone], Fax: [phone]. Hours of Operation:  8:30 am - 5:30 pm, M-F.  Also accepts Medicaid and self-pay.  [facility] [place] [address], [place] Phone: [phone]   [facility] [address] Natasha Bence [place], [place] [phone], Ext. [phone] Mondays & Thursdays: 7-9 AM.  First 15 patients are seen on a first come, first serve basis.    Medicaid-accepting [facility] Providers:  Organization         Address  Phone   Notes  [facility] [address], [place] [phone] Also accepts self-pay patients.  [facility] [address] Laurell Josephs [place], [place]  [phone]   [facility] [address], [place] [phone]   [facility] [address] [place] [phone]   Renaye Rakers [address], [place]   [phone] Only accepts [place] Access [place] patients after they have their name applied to their card.   Self-Pay (no insurance) in [facility]:  Organization         Address  Phone   Notes  Sickle Cell Patients, [facility] Internal Medicine [address] [place], [place] [phone]   [facility] [address] [place], [place] [phone]   [facility] [place]  [address], [place] [phone]   [facility]/Dr. Osei-Bonsu  [address] [place] or [address], [place] [phone] Phone number for both [place] and [place] locations is the same.  [facility] [address] [place] [phone]   [facility] [address], [place] or [address] [phone]   [facility] [address] [place] [phone], phone; [phone], fax Sees patients 1st and 3rd Saturday of every month.  Must not qualify for public or private insurance (i.e. Medicaid, Medicare, [place] Health Choice, Veterans' Benefits)  Household income should be no more than 200% of the poverty level The clinic cannot treat you if you are pregnant or think you are pregnant  Sexually transmitted diseases are not treated at the clinic.    Dental Care: Organization         Address  Phone  Notes  [facility] Department of [facility] [address] [place], [place] [phone] Accepts children up to age 2 who are enrolled in [place] or [place] Health Choice; pregnant women with a Medicaid card; and  children who have applied for Medicaid or [place] Health Choice, but were declined, whose parents can pay a reduced fee at time of service.  [facility] Department of [facility]  [address], [place] [phone] Accepts children up to age 5 who are enrolled in [place] or [place] Health Choice; pregnant women with a Medicaid card; and children who have applied for Medicaid or [place] Health Choice, but were declined, whose parents can pay a reduced fee at time of service.  [facility] Access PRO[facility]  [address] [place], [place] [phone] Patients  are seen by appointment only. Walk-ins are not accepted. [facility] will see patients 59 years of age and older. Monday - Tuesday (8am-5pm) Most Wednesdays (8:30-5pm) $30 per visit, cash only  [facility] Access [facility]  [address], [facility] [phone] Patients are seen by appointment only. Walk-ins are not accepted. [facility] will see patients 61 years of age and older. One Wednesday Evening (Monthly: Volunteer Based).  $30 per visit, cash only  [employer]  [phone] for adults; Children under age 29, call Graduate Pediatric Dentistry at [phone]. Children aged 56-14, please call [phone] to request a pediatric application.  Dental services are provided in all areas of dental care including fillings, crowns and bridges, complete and partial dentures, implants, gum treatment, root canals, and extractions. Preventive care is also provided. Treatment is provided to both adults and children. Patients are selected via a lottery and there is often a waiting list.   [facility] [address] [place]  [phone] [email or web address]   [facility] [address] [place], [place] [phone], Ext. [phone] Second and Fourth Thursday of each month, opens at 6:30 AM; Clinic ends at 9 AM.  Patients are seen on a first-come first-served  basis, and a limited number are seen during each clinic.   [facility]  [address] Ether [place]ins [place], [place] [phone]   Eligibility Requirements You must have lived in [place], [place], or [place] counties for at least the last three months.   You cannot be eligible for state or federal sponsored [place], including [employer], [place], or [employer].   You generally cannot be eligible for healthcare insurance through your employer.    How to apply: Eligibility screenings are held every Tuesday and Wednesday afternoon from 1:00 pm until 4:00 pm. You do not need an appointment for the interview!  [facility] [address], [place], [place] [phone]   [facility] Health Department  [phone]   [facility]  [phone]   [facility] Health Department  [phone]    Behavioral Health Resources in the Community: Intensive Outpatient Programs Organization         Address  Phone  Notes  [facility] Services [address] [place], [place] [phone]   [facility] Outpatient [address] [place], [place] [phone]   [facility] [address] [place], [place]  [phone]   [facility] Mental Health [address],  [place], [place] [phone] or [phone]   Substance Abuse Resources Organization         Address  Phone  Notes  [facility]  [phone]   [facility]  [phone]   [facility]  [phone]   Floydene Flock  [phone]   Residential & Outpatient Substance Abuse Program  [phone]   Psychological Services Organization         Address  Phone  Notes  [facility]  [phone]   [facility] Services  [phone]   [facility] Mental Health [address], [place] [phone] or [phone]    Mobile Crisis Teams Organization          Address  Phone  Notes  [facility], Mobile Crisis Care Unit  [phone]   [facility]  [address] [place], [place] [phone]   [facility] [address], [place] 92  [place] [phone]    Self-Help/Support Groups Organization         Address  Phone             Notes  [facility] [place] - variety of support groups  336- I7437963 Call for more information  Narcotics Anonymous (NA), [facility] [address], [employer] [place]  2 meetings at this location   Statistician         Address  Phone  Notes  [facility] [address],    [place]  [phone]   [facility]  [address] [place] [address]16[address], [place], [place] [phone]   [facility] Treatment Facility [address] [place], [place] [phone] Admissions: 8am-3pm M-F  [facility] [address],    [place], [place] [phone]   [facility] [address] [place], [place], [place] [phone]   [facility] [address]  [place], [place] [phone]   [facility] - Intensive Outpatient [address] Laurell Josephs [address], [place], [place] [phone]   [facility] (Addiction [facility] [address] [place].,  [place], [place] [phone] or [phone]   [facility] ([facility]) [address], [place], [place] [phone] Accepts Medicaid  [facility] [address],  [place] [phone] Substance Abuse/Addiction Treatment   [facility] Organization         Address  Phone  Notes  [facility]  [phone]   Angie Fava, PhD [address] Ervin Knack [place], [place]   [phone] or [phone]   [facility]   [address] [place], [place] [phone]   [facility] [address], [place], [place] [phone] Insurance/Medicaid/sponsorship  through [facility] and Families [address]                                    [place], [place] [phone] Therapy/tele-psych/case  [facility] [address], [place] [phone]    Dr. Lolly Mustache  [phone]   [facility]  [employer] [facility] Dept. 1) [address] [place] 2) [address], [place] 3)  [address], [place] [phone]  [phone]   [facility] Child Abuse Hotline 478-671-7380 or [phone] (After Hours)

## 2014-10-29 NOTE — ED Notes (Signed)
Pt talking on phone, interactive with staff, calm & cooperative.  Pt denies SI at present, reports she is under a lot of stress at school and just broke up with boyfriend.  Monitoring for safety, Q 15 min checks in effect at present.

## 2014-10-29 NOTE — BH Assessment (Signed)
Assessment Note  Mallory Johnson is an 24 y.o. 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 She was brought to [facility] by a Veterinary surgeon from [place]. Sts she went to [facility] to speak with a counselor as she has felt depressed and overwhelmed x2-3 weeks. Patient also mentioned suicidal thoughts stating that she mentioned "something about traffic". Patient denies intent/plan. Sts, "I was just so depressed about stressed out but I truly didn't mean to say those things". Per ED notes, patient mentioned jumping off a bridge 2-3 days. Writer asked patient about those thoughts. Patient responds, "Well I did have thoughts but they were just thoughts". Patient stating to this writer that she is a double major (dance Agricultural engineer). Sts that she has many ambitious and "I love my life". She denies previous history of suicide attempts/gestures. She denies self mutilating behaviors. Patient is able to contract for safety. Sts that she is close with roommates and they watch over each other. Patient's depression and suicidal thoughts are triggered by a break up with boyfriend and facing charges related to sorority hazing. She admits to depression evidence by loss of interest in usual pleasures, fatigue, and loss of appetite. She sleeps well. She denies HI and AVH's. No alcohol of drug use. She denies history of inpatient mental health hospitalizations. Patient is not prescribed any psychotropic medications at this time.   Axis I: Depressive Disorder NOS Axis II: Deferred Axis III: History reviewed. No pertinent past medical history. Axis IV: other psychosocial or environmental problems, problems related to social environment, problems with access to health care services and problems with primary support group Axis V: 31-40 impairment in reality testing  Past Medical History: History reviewed. No pertinent past medical history.  History reviewed. No pertinent past surgical history.  Family History: History reviewed. No  pertinent family history.  Social History:  reports that she has never smoked. She does not have any smokeless tobacco history on file. She reports that she does not drink alcohol or use illicit drugs.  Additional Social History:  Alcohol / Drug Use Pain Medications: SEE MAR Prescriptions: SEE MAR Over the Counter: SEE MAR History of alcohol / drug use?: No history of alcohol / drug abuse  CIWA: CIWA-Ar BP: 103/71 mmHg Pulse Rate: 91 COWS:    Allergies: No Known Allergies  Home Medications:  (Not in a hospital admission)  OB/GYN Status:  No LMP recorded.  General Assessment Data Location of Assessment: [facility] ED Is this a Tele or Face-to-Face Assessment?: Face-to-Face Is this an Initial Assessment or a Re-assessment for this encounter?: Initial Assessment Living Arrangements: Other (Comment) ([employer] with 2 roommates) Can pt return to current living arrangement?: Yes Admission Status: Voluntary Is patient capable of signing voluntary admission?: Yes Transfer from: Acute Hospital Referral Source: Self/Family/Friend     [facility] Crisis Care Plan Living Arrangements: Other (Comment) ([employer] with 2 roommates) Name of Psychiatrist:  (No psychiatrist ) Name of Therapist:  (No therapist )  Education Status Is patient currently in school?: Yes ([employer]) Current Grade:  (senior in college "double major") Highest grade of school patient has completed:  (12th grade )  Risk to self with the past 6 months Suicidal Ideation: No-Not Currently/Within Last 6 Months (patient endorsed suicidal ideations ) Suicidal Intent: No (patient denies ) Is patient at risk for suicide?: No (Patient able to contract for safety ) Suicidal Plan?: No-Not Currently/Within Last 6 Months (Patient spoke of jumping off hotel and/or into traffic ) Specify Current Suicidal Plan:  (  patient reported plan ) Access to Means: No What has been your use of drugs/alcohol within the last 12 months?:  (patient denies  ) Previous Attempts/Gestures: No How many times?:  (0) Other Self Harm Risks:  (none reported ) Triggers for Past Attempts: Other (Comment) (no previous attempts or gestures) Intentional Self Injurious Behavior: None Family Suicide History: No Recent stressful life event(s): Other (Comment) (broke up w/ boyfriend facing legal charges for hazing) Persecutory voices/beliefs?: No Depression: Yes Depression Symptoms: Feeling angry/irritable, Feeling worthless/self pity, Fatigue, Guilt, Loss of interest in usual pleasures, Isolating, Tearfulness, Insomnia, Despondent Substance abuse history and/or treatment for substance abuse?: No Suicide prevention information given to non-admitted patients: Not applicable  Risk to Others within the past 6 months Homicidal Ideation: No Thoughts of Harm to Others: No Current Homicidal Intent: No Current Homicidal Plan: No Access to Homicidal Means: No Identified Victim:  (n/a) History of harm to others?: No Assessment of Violence: None Noted Violent Behavior Description:  (patient calm and cooperative ) Does patient have access to weapons?: No Criminal Charges Pending?: No Does patient have a court date: No  Psychosis Hallucinations: None noted Delusions: None noted  Mental Status Report Appearance/Hygiene: Disheveled Eye Contact: Good Motor Activity: Freedom of movement Speech: Logical/coherent Level of Consciousness: Alert Mood: Depressed Affect: Appropriate to circumstance Anxiety Level: Minimal Thought Processes: Coherent, Relevant Judgement: Unimpaired Orientation: Person, Place, Situation, Time Obsessive Compulsive Thoughts/Behaviors: None  Cognitive Functioning Concentration: Decreased Memory: Recent Intact, Remote Intact IQ: Average Insight: Fair Impulse Control: Good Appetite: Poor Weight Loss:  (Sts, "Since breaking up with my boyfriend I haven't eaten mu) Weight Gain:  (none reported ) Sleep: Decreased Total Hours of  Sleep:  ("I get a decent amount of sleep"; exact amt unk) Vegetative Symptoms: None  ADLScreening [facility] Assessment Services) Patient's cognitive ability adequate to safely complete daily activities?: Yes Patient able to express need for assistance with ADLs?: Yes Independently performs ADLs?: Yes (appropriate for developmental age)  Prior Inpatient Therapy Prior Inpatient Therapy: No Prior Therapy Dates:  (n/a) Prior Therapy Facilty/Provider(s):  (n/a) Reason for Treatment:  (n/a)  Prior Outpatient Therapy Prior Outpatient Therapy: Yes Prior Therapy Dates:  (current; 1x visit) Prior Therapy Facilty/Provider(s):  ([employer] counseling clinic ) Reason for Treatment:  (depression and anxiety )  ADL Screening (condition at time of admission) Patient's cognitive ability adequate to safely complete daily activities?: Yes Is the patient deaf or have difficulty hearing?: No Does the patient have difficulty seeing, even when wearing glasses/contacts?: No Does the patient have difficulty concentrating, remembering, or making decisions?: No Patient able to express need for assistance with ADLs?: Yes Does the patient have difficulty dressing or bathing?: No Independently performs ADLs?: Yes (appropriate for developmental age) Does the patient have difficulty walking or climbing stairs?: No Weakness of Legs: None Weakness of Arms/Hands: None  Home Assistive Devices/Equipment Home Assistive Devices/Equipment: None    Abuse/Neglect Assessment (Assessment to be complete while patient is alone) Physical Abuse: Denies Verbal Abuse: Denies Sexual Abuse: Denies Exploitation of patient/patient's resources: Denies Self-Neglect: Denies Values / Beliefs Cultural Requests During Hospitalization: None Spiritual Requests During Hospitalization: None   Advance Directives (For Healthcare) Does patient have an advance directive?: No Would patient like information on creating an advanced directive?: No -  patient declined information    Additional Information 1:1 In Past 12 Months?: No CIRT Risk: No Elopement Risk: No Does patient have medical clearance?: Yes     Disposition:  Disposition Initial Assessment Completed for this Encounter: Yes Disposition  of Patient: Other dispositions Donell Sievert(Spencer Simon, [place] will complete a TP; dispo pending)  On Site Evaluation by:   Reviewed with Physician:    Melynda Rippleerry, Vincenta Steffey [facility] 10/29/2014 7:46 PM

## 2014-10-29 NOTE — ED Notes (Addendum)
Pt brought in my [employer] counseling services. Pt reports SI thoughts, pt has had a lot of stressors in her life in the past 2 weeks, issues with her boyfriend and sorority, pt was being accused of hazing. On Saturday 4/16 pt thought about jumping off a hotel. Today pt thought she was going to get criminal charges because she tried to impersonate a pledge on the phone and had thoughts of walking into traffic. Pt reports the SI thoughts left after she was assured she would not get criminal charges.   Pt denies HI, AH/VH. Pt reports if she had known the whole process and that she would not be able to keep her cell phone with her she would not have [facility]. Pt contracts for safety.

## 2014-10-29 NOTE — Consult Note (Signed)
Telepsych Consultation   Reason for Consult:  Psychiatric disposition Referring Physician:  [facility] EDP Patient Identification: Mardene Lessig MRN:  [id number] Principal Diagnosis: Disturbance of emotions with anxiety in acute stress reaction Diagnosis:   Patient Active Problem List   Diagnosis Date Noted  . Mood disorder [F39]   . Anxiety in acute stress reaction [F41.9]   . Passive suicidal ideations [R45.851]     Total Time spent with patient: 45 minutes  Subjective:   Maleigh Bagot is a 24 y.o. female patient  HPI:  Patient is a 24 y/o AAF, presenting voluntarily to the [facility] ED for psychiatric evaluation after communicating to her counselor about some passive SI she has been having over the last two and a half weeks, The identified stressor include some hazing allegations she is dealing with while participating in a pledging process at [facility] and a recent break up with her boyfriend. 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 She is very hopeful about her future which may include traveling and working in [place] in addition to some entrepreneurial opportunities. The patient has a strong support system which includes her relationship with her parents, school mates and her faith. The patient rates her stress level a 1/10 at this current time. The patient is denying feeling depressed, nor has she been experiencing insomnia, anhedonia, irritability, weight loss or anorexia, but has been having some crying spells. The patient is denying any prior psychiatric history to include depression, anxiety, bipolar, ADHD or schizophrenia. The patient is denying a hx of PTSD or prior or current sexual, physical or emotional abuses. The patient denies a family history of psychiatric illnesses. Patient is denying any hx of TBI or concussions. Patient is denying any history of substance abuse or alcohol intake. Her  past medical history is unremarkable and she is not on any chronic prescriptive medications at this present time. The patient has no previous psychiatric in-patient or out-patient interventions and is denying any SI with plan, SA /HI or AVH. The patient is open to out patient psychiatric resources on a prn basis and is not wanting to proceed with further in-patient evaluation at this time.  MSE: The patient is a 24 year old, appearing as stated age, neat in appearance with seemingly good hygiene. The patient maintained good eye contact throughout the course of the interview. Patient was mildly dysphoric with congruent effect. Her voice was normal tone and without pressured speech. Her thought process was logical and shoed good insight and goal directed.No evidence of AVH, delusions or paranoia. Patient with good insight and impulse control.  SAD PERSONS scale S - Sex:  1 if female; 0 if female; (more females attempt, more males succeed) A - Age: 66 if < 20 or > 51 D - Depression: 1 if depression is present P - Previous attempt: 1 if present E -Ethanol abuse: 1 if present R - Rational thinking loss: 1 if present S - Social Supports Lacking: 1 if present O - Organized Plan: 1 if plan is made and lethal N - No Spouse: 1 if divorced, widowed, separated, or single S - Sickness: 1 if chronic, debilitating, and severe Guidelines for action with the SAD PERSONS scale  Total points  Proposed clinical action    0 to 2   Send home with follow-up    3 to 4   Close follow-up; consider hospitalization      5 to  6   Strongly consider hospitalization, depending on confidence in the follow-up arrangement    7 to 10   Hospitalize or commit                  SAD Persons Scale Score (1)  HPI Elements:    Location:anxious, over whelmed with alteration in emotions Quality: crying Severity:minimal, no SI/SA/HI Timing:two and a half weeks Duration: Acute Context: stress with relationships  and demands with school and extracurricular activities Depressive symptoms:   Past Medical History: History reviewed. No pertinent past medical history. History reviewed. No pertinent past surgical history. Family History: History reviewed. No pertinent family history. Social History:  History  Alcohol Use No     History  Drug Use No    History   Social History  . Marital Status: Single    Spouse Name: N/A  . Number of Children: N/A  . Years of Education: N/A   Social History Main Topics  . Smoking status: Never Smoker   . Smokeless tobacco: Not on file  . Alcohol Use: No  . Drug Use: No  . Sexual Activity: Yes    Birth Control/ Protection: None   Other Topics Concern  . None   Social History Narrative   Additional Social History:    Pain Medications: SEE MAR Prescriptions: SEE MAR Over the Counter: SEE MAR History of alcohol / drug use?: No history of alcohol / drug abuse                     Allergies:  No Known Allergies  Labs:  Results for orders placed or performed during the hospital encounter of 10/29/14 (from the past 48 hour(s))  Urine Drug Screen     Status: None   Collection Time: 10/29/14  5:20 PM  Result Value Ref Range   Opiates NONE DETECTED NONE DETECTED   Cocaine NONE DETECTED NONE DETECTED   Benzodiazepines NONE DETECTED NONE DETECTED   Amphetamines NONE DETECTED NONE DETECTED   Tetrahydrocannabinol NONE DETECTED NONE DETECTED   Barbiturates NONE DETECTED NONE DETECTED    Comment:        DRUG SCREEN FOR MEDICAL PURPOSES ONLY.  IF CONFIRMATION IS NEEDED FOR ANY PURPOSE, NOTIFY LAB WITHIN 5 DAYS.        LOWEST DETECTABLE LIMITS FOR URINE DRUG SCREEN Drug Class       Cutoff (ng/mL) Amphetamine      1000 Barbiturate      200 Benzodiazepine   270 Tricyclics       623 Opiates          300 Cocaine          300 THC              50   Acetaminophen level     Status: Abnormal   Collection Time: 10/29/14  5:28 PM  Result Value Ref  Range   Acetaminophen (Tylenol), Serum <10.0 (L) 10 - 30 ug/mL    Comment:        THERAPEUTIC CONCENTRATIONS VARY SIGNIFICANTLY. A RANGE OF 10-30 ug/mL MAY BE AN EFFECTIVE CONCENTRATION FOR MANY PATIENTS. HOWEVER, SOME ARE BEST TREATED AT CONCENTRATIONS OUTSIDE THIS RANGE. ACETAMINOPHEN CONCENTRATIONS >150 ug/mL AT 4 HOURS AFTER INGESTION AND >50 ug/mL AT 12 HOURS AFTER INGESTION ARE OFTEN ASSOCIATED WITH TOXIC REACTIONS.   Ethanol (ETOH)     Status: None   Collection Time: 10/29/14  5:28 PM  Result Value Ref Range   Alcohol, Ethyl (B) <5  0 - 9 mg/dL    Comment:        LOWEST DETECTABLE LIMIT FOR SERUM ALCOHOL IS 11 mg/dL FOR MEDICAL PURPOSES ONLY   Salicylate level     Status: None   Collection Time: 10/29/14  5:28 PM  Result Value Ref Range   Salicylate Lvl <0.1 2.8 - 20.0 mg/dL  CBC     Status: None   Collection Time: 10/29/14  5:29 PM  Result Value Ref Range   WBC 6.4 4.0 - 10.5 K/uL   RBC 4.76 3.87 - 5.11 MIL/uL   Hemoglobin 14.4 12.0 - 15.0 g/dL   HCT 43.8 36.0 - 46.0 %   MCV 92.0 78.0 - 100.0 fL   MCH 30.3 26.0 - 34.0 pg   MCHC 32.9 30.0 - 36.0 g/dL   RDW 11.8 11.5 - 15.5 %   Platelets 316 150 - 400 K/uL  Comprehensive metabolic panel     Status: Abnormal   Collection Time: 10/29/14  5:29 PM  Result Value Ref Range   Sodium 141 135 - 145 mmol/L   Potassium 3.1 (L) 3.5 - 5.1 mmol/L   Chloride 104 96 - 112 mmol/L   CO2 29 19 - 32 mmol/L   Glucose, Bld 85 70 - 99 mg/dL   BUN 11 6 - 23 mg/dL   Creatinine, Ser 0.85 0.50 - 1.10 mg/dL   Calcium 9.4 8.4 - 10.5 mg/dL   Total Protein 8.2 6.0 - 8.3 g/dL   Albumin 5.1 3.5 - 5.2 g/dL   AST 17 0 - 37 U/L   ALT 11 0 - 35 U/L   Alkaline Phosphatase 46 39 - 117 U/L   Total Bilirubin 1.4 (H) 0.3 - 1.2 mg/dL   GFR calc non Af Amer >90 >90 mL/min   GFR calc Af Amer >90 >90 mL/min    Comment: (NOTE) The eGFR has been calculated using the CKD EPI equation. This calculation has not been validated in all clinical  situations. eGFR's persistently <90 mL/min signify possible Chronic Kidney Disease.    Anion gap 8 5 - 15    Vitals: Blood pressure 113/82, pulse 67, temperature 97.7 F (36.5 C), temperature source Oral, resp. rate 18, SpO2 99 %.  Risk to Self: Suicidal Ideation: No-Not Currently/Within Last 6 Months (patient endorsed suicidal ideations ) Suicidal Intent: No (patient denies ) Is patient at risk for suicide?: No (Patient able to contract for safety ) Suicidal Plan?: No-Not Currently/Within Last 6 Months (Patient spoke of jumping off hotel and/or into traffic ) Specify Current Suicidal Plan:  (patient reported plan ) Access to Means: No What has been your use of drugs/alcohol within the last 12 months?:  (patient denies ) How many times?:  (0) Other Self Harm Risks:  (none reported ) Triggers for Past Attempts: Other (Comment) (no previous attempts or gestures) Intentional Self Injurious Behavior: None Risk to Others: Homicidal Ideation: No Thoughts of Harm to Others: No Current Homicidal Intent: No Current Homicidal Plan: No Access to Homicidal Means: No Identified Victim:  (n/a) History of harm to others?: No Assessment of Violence: None Noted Violent Behavior Description:  (patient calm and cooperative ) Does patient have access to weapons?: No Criminal Charges Pending?: No Does patient have a court date: No Prior Inpatient Therapy: Prior Inpatient Therapy: No Prior Therapy Dates:  (n/a) Prior Therapy Facilty/Provider(s):  (n/a) Reason for Treatment:  (n/a) Prior Outpatient Therapy: Prior Outpatient Therapy: Yes Prior Therapy Dates:  (current; 1x visit) Prior Therapy Facilty/Provider(s):  ([employer]  counseling clinic ) Reason for Treatment:  (depression and anxiety )  Current Facility-Administered Medications  Medication Dose Route Frequency Provider Last Rate Last Dose  . acetaminophen (TYLENOL) tablet 650 mg  650 mg Oral Q4H PRN Dahlia Bailiff, PA-C      . alum & mag  hydroxide-simeth (MAALOX/MYLANTA) 200-200-20 MG/5ML suspension 30 mL  30 mL Oral PRN Dahlia Bailiff, PA-C      . ibuprofen (ADVIL,MOTRIN) tablet 600 mg  600 mg Oral Q8H PRN Dahlia Bailiff, PA-C      . nicotine (NICODERM CQ - dosed in mg/24 hours) patch 21 mg  21 mg Transdermal Daily Dahlia Bailiff, PA-C      . ondansetron [facility]) tablet 4 mg  4 mg Oral Q8H PRN Dahlia Bailiff, PA-C      . zolpidem (AMBIEN) tablet 5 mg  5 mg Oral QHS PRN Dahlia Bailiff, PA-C       Current Outpatient Prescriptions  Medication Sig Dispense Refill  . BIOTIN PO Take by mouth daily.    . benzonatate (TESSALON) 100 MG capsule Take 1 capsule (100 mg total) by mouth every 8 (eight) hours. (Patient not taking: Reported on 09/08/2014) 21 capsule 0  . Bismuth Subsalicylate (KAOPECTATE PO) Take 2 tablets by mouth 2 (two) times daily as needed (diarrhea).    . fluticasone (FLONASE) 50 MCG/ACT nasal spray Place 2 sprays into both nostrils daily. (Patient not taking: Reported on 09/08/2014) 16 g 0  . ibuprofen (ADVIL,MOTRIN) 600 MG tablet Take 1 tablet (600 mg total) by mouth every 6 (six) hours as needed. (Patient not taking: Reported on 09/08/2014) 30 tablet 0  . loperamide (IMODIUM A-D) 2 MG tablet Take 2 mg by mouth 5 (five) times daily as needed for diarrhea or loose stools.    . ondansetron (ZOFRAN ODT) 8 MG disintegrating tablet Take 1 tablet (8 mg total) by mouth every 8 (eight) hours as needed for nausea or vomiting. (Patient not taking: Reported on 10/29/2014) 12 tablet 0  . predniSONE (DELTASONE) 20 MG tablet Take 2 tablets (40 mg total) by mouth daily. (Patient not taking: Reported on 09/08/2014) 10 tablet 0    Musculoskeletal: Strength & Muscle Tone: within normal limits Gait & Station: normal Patient leans: N/A  Psychiatric Specialty Exam:     Blood pressure 113/82, pulse 67, temperature 97.7 F (36.5 C), temperature source Oral, resp. rate 18, SpO2 99 %.There is no weight on file to calculate BMI.  General Appearance: Casual  Eye  Contact::  Good  Speech:  Clear and Coherent  Volume:  Normal  Mood:  Dysphoric  Affect:  Congruent  Thought Process:  Circumstantial  Orientation:  Full (Time, Place, and Person)  Thought Content:  NA  Suicidal Thoughts:  No  Homicidal Thoughts:  No  Memory:  Immediate;   Good  Judgement:  Good  Insight:  Good  Psychomotor Activity:  Normal  Concentration:  Good  Recall:  Good  Fund of Knowledge:Good  Language: Good  Akathisia:  No  Handed:  Right  AIMS (if indicated):     Assets:  Communication Skills Social Support  ADL's:  Intact  Cognition: WNL  Sleep:      Medical Decision Making: New problem, with no additional work up planned    Treatment Plan Summary: Patient is not meeting IP criteria for crises mgmt, safety and or stabilization. Patient reassurance given, recommend out-patient psychiatric resources as needed  Plan:  No evidence of imminent risk to self or others at present.   Supportive  therapy provided about ongoing stressors. Disposition: D/c home  SIMON,SPENCER E 10/29/2014 11:02 PM  I agree with assessment and plan Geralyn Flash A. Sabra Heck, M.D.

## 2015-03-19 ENCOUNTER — Encounter (HOSPITAL_COMMUNITY): Payer: Self-pay | Admitting: Emergency Medicine

## 2015-03-19 DIAGNOSIS — Z79899 Other long term (current) drug therapy: Secondary | ICD-10-CM | POA: Diagnosis not present

## 2015-03-19 DIAGNOSIS — J029 Acute pharyngitis, unspecified: Secondary | ICD-10-CM | POA: Insufficient documentation

## 2015-03-19 DIAGNOSIS — K029 Dental caries, unspecified: Secondary | ICD-10-CM | POA: Insufficient documentation

## 2015-03-19 DIAGNOSIS — R220 Localized swelling, mass and lump, head: Secondary | ICD-10-CM | POA: Insufficient documentation

## 2015-03-19 DIAGNOSIS — R51 Headache: Secondary | ICD-10-CM | POA: Diagnosis present

## 2015-03-19 DIAGNOSIS — H9209 Otalgia, unspecified ear: Secondary | ICD-10-CM | POA: Diagnosis not present

## 2015-03-19 DIAGNOSIS — R110 Nausea: Secondary | ICD-10-CM | POA: Diagnosis not present

## 2015-03-19 DIAGNOSIS — R0981 Nasal congestion: Secondary | ICD-10-CM | POA: Diagnosis not present

## 2015-03-19 DIAGNOSIS — R6884 Jaw pain: Secondary | ICD-10-CM | POA: Insufficient documentation

## 2015-03-19 MED ORDER — ONDANSETRON 4 MG PO TBDP
ORAL_TABLET | ORAL | Status: AC
  Filled 2015-03-19: qty 2

## 2015-03-19 MED ORDER — ONDANSETRON 4 MG PO TBDP
8.0000 mg | ORAL_TABLET | Freq: Once | ORAL | Status: AC
  Administered 2015-03-19: 8 mg via ORAL

## 2015-03-19 NOTE — ED Notes (Signed)
Pt. reports intermittent right facial pain with mild nausea onset today , no pain at arrival , denies injury , no fever or chills.

## 2015-03-20 ENCOUNTER — Emergency Department (HOSPITAL_COMMUNITY)
Admission: EM | Admit: 2015-03-20 | Discharge: 2015-03-20 | Disposition: A | Discharge status: Home / Self Care | Payer: 59 | Attending: Emergency Medicine | Admitting: Emergency Medicine

## 2015-03-20 DIAGNOSIS — R6884 Jaw pain: Secondary | ICD-10-CM

## 2015-03-20 LAB — I-STAT CHEM 8, ED
BUN: 14 mg/dL (ref 6–20)
CHLORIDE: 102 mmol/L (ref 101–111)
CREATININE: 1.1 mg/dL — AB (ref 0.44–1.00)
Calcium, Ion: 1.25 mmol/L — ABNORMAL HIGH (ref 1.12–1.23)
Glucose, Bld: 83 mg/dL (ref 65–99)
HCT: 49 % — ABNORMAL HIGH (ref 36.0–46.0)
HEMOGLOBIN: 16.7 g/dL — AB (ref 12.0–15.0)
POTASSIUM: 3.9 mmol/L (ref 3.5–5.1)
Sodium: 141 mmol/L (ref 135–145)
TCO2: 27 mmol/L (ref 0–100)

## 2015-03-20 LAB — I-STAT BETA HCG BLOOD, ED (MC, WL, AP ONLY)

## 2015-03-20 MED ORDER — ONDANSETRON HCL 4 MG PO TABS: 4.0000 mg | ORAL_TABLET | Freq: Four times a day (QID) | ORAL | Status: AC

## 2015-03-20 MED ORDER — ONDANSETRON 4 MG PO TBDP
4.0000 mg | ORAL_TABLET | Freq: Once | ORAL | Status: AC
  Administered 2015-03-20: 4 mg via ORAL
  Filled 2015-03-20: qty 1

## 2015-03-20 MED ORDER — AMOXICILLIN 500 MG PO CAPS: 500.0000 mg | ORAL_CAPSULE | Freq: Three times a day (TID) | ORAL | Status: AC

## 2015-03-20 MED ORDER — IBUPROFEN 400 MG PO TABS
800.0000 mg | ORAL_TABLET | Freq: Once | ORAL | Status: AC
  Administered 2015-03-20: 800 mg via ORAL
  Filled 2015-03-20: qty 2

## 2015-03-20 NOTE — ED Notes (Signed)
Patient left at this time with all belongings. 

## 2015-03-20 NOTE — ED Provider Notes (Signed)
CSN: [id number]     Arrival date & time 03/19/15  2242 History   First MD Initiated Contact with Patient 03/20/15 0106     Chief Complaint  Patient presents with  . Facial Pain  . Nausea     (Consider location/radiation/quality/duration/timing/severity/associated sxs/prior Treatment) HPI   Mallory Johnson 23 y.o.female  PCP: OEI, MONICA, MD  Blood pressure 111/75, pulse 63, temperature 98.4 F (36.9 C), temperature source Oral, resp. rate 16, last menstrual period 03/01/2015, SpO2 100 %.  SIGNIFICANT PMH: None CHIEF COMPLAINT: facial pain and swelling, nausea  When: patient reports this evening she started to not feel well How: She is having ear pain, facial swelling, nasal congestion Chronicity: acute Location: N/A Radiation: n/a Quality and severity: n/a Alleviating factors: None tried Worsening factors: none tried Treatments tried: none tried Associated Symptoms: nausea, nasal congestion, sore throat Negative ROS: Confusion, diaphoresis, fever, headache, weakness (general or focal), change of vision,  neck pain, dysphagia, aphagia, chest pain, shortness of breath,  back pain, abdominal pains, vomiting, diarrhea, lower extremity swelling, rash.    History reviewed. No pertinent past medical history. History reviewed. No pertinent past surgical history. No family history on file. Social History  Substance Use Topics  . Smoking status: Never Smoker   . Smokeless tobacco: None  . Alcohol Use: No   OB History    No data available     Review of Systems  10 Systems reviewed and are negative for acute change except as noted in the HPI.     Allergies  Review of patient's allergies indicates no known allergies.  Home Medications   Prior to Admission medications   Medication Sig Start Date End Date Taking? Authorizing Provider  amoxicillin (AMOXIL) 500 MG capsule Take 1 capsule (500 mg total) by mouth 3 (three) times daily. 03/20/15   Aasiyah Auerbach Neva Seat, PA-C   benzonatate (TESSALON) 100 MG capsule Take 1 capsule (100 mg total) by mouth every 8 (eight) hours. Patient not taking: Reported on 09/08/2014 06/11/14   Antony Madura, PA-C  BIOTIN PO Take by mouth daily.    Historical Provider, MD  Bismuth Subsalicylate (KAOPECTATE PO) Take 2 tablets by mouth 2 (two) times daily as needed (diarrhea).    Historical Provider, MD  fluticasone (FLONASE) 50 MCG/ACT nasal spray Place 2 sprays into both nostrils daily. Patient not taking: Reported on 09/08/2014 06/11/14   Antony Madura, PA-C  ibuprofen (ADVIL,MOTRIN) 600 MG tablet Take 1 tablet (600 mg total) by mouth every 6 (six) hours as needed. Patient not taking: Reported on 09/08/2014 06/11/14   Antony Madura, PA-C  loperamide (IMODIUM A-D) 2 MG tablet Take 2 mg by mouth 5 (five) times daily as needed for diarrhea or loose stools.    Historical Provider, MD  ondansetron (ZOFRAN ODT) 8 MG disintegrating tablet Take 1 tablet (8 mg total) by mouth every 8 (eight) hours as needed for nausea or vomiting. Patient not taking: Reported on 10/29/2014 09/08/14   Azalia Bilis, MD  ondansetron (ZOFRAN) 4 MG tablet Take 1 tablet (4 mg total) by mouth every 6 (six) hours. 03/20/15   Carra Brindley Neva Seat, PA-C  predniSONE (DELTASONE) 20 MG tablet Take 2 tablets (40 mg total) by mouth daily. Patient not taking: Reported on 09/08/2014 06/11/14   Antony Madura, PA-C   BP 111/75 mmHg  Pulse 63  Temp(Src) 98.4 F (36.9 C) (Oral)  Resp 16  SpO2 100%  LMP 03/01/2015 Physical Exam  Constitutional: She appears well-developed and well-nourished. No distress.  HENT:  Head: Normocephalic  and atraumatic.  Mouth/Throat: Uvula is midline, oropharynx is clear and moist and mucous membranes are normal. Normal dentition. Dental caries (Pts tooth shows no obvious abscess but moderate to severe tenderness to palpation of marked tooth) present. No uvula swelling.  Eyes: Pupils are equal, round, and reactive to light.  Neck: Trachea normal, normal range of motion  and full passive range of motion without pain. Neck supple.  Cardiovascular: Normal rate, regular rhythm, normal heart sounds and normal pulses.   Pulmonary/Chest: Effort normal and breath sounds normal. No respiratory distress. Chest wall is not dull to percussion. She exhibits no tenderness, no crepitus, no edema, no deformity and no retraction.  Abdominal: Soft. Normal appearance.  Musculoskeletal: Normal range of motion.  Neurological: She is alert. She has normal strength.  Skin: Skin is warm, dry and intact. She is not diaphoretic.  Psychiatric: She has a normal mood and affect. Her speech is normal. Cognition and memory are normal.  Nursing note and vitals reviewed.   ED Course  Procedures (including critical care time) Labs Review Labs Reviewed  I-STAT CHEM 8, ED - Abnormal; Notable for the following:    Creatinine, Ser 1.10 (*)    Calcium, Ion 1.25 (*)    Hemoglobin 16.7 (*)    HCT 49.0 (*)    All other components within normal limits  I-STAT BETA HCG BLOOD, ED ([facility], [facility], [facility] ONLY)    Imaging Review No results found. I have personally reviewed and evaluated these images and lab results as part of my medical decision-making.   EKG Interpretation   Date/Time:  Wednesday March 19 2015 23:27:08 EDT Ventricular Rate:  64 PR Interval:  148 QRS Duration: 74 QT Interval:  396 QTC Calculation: 408 R Axis:   71 Text Interpretation:  Normal sinus rhythm Normal ECG ED PHYSICIAN  INTERPRETATION AVAILABLE IN [facility] Confirmed by TEST, Record  ([id number]) on 03/20/2015 7:02:47 AM      MDM   Final diagnoses:  Jaw pain   ondansetron (ZOFRAN) 4 MG tablet Take 1 tablet (4 mg total) by mouth every 6 (six) hours. 12 tablet Marlon Pel, PA-C   amoxicillin (AMOXIL) 500 MG capsule Take 1 capsule (500 mg total) by mouth 3 (three) times daily. 21 capsule Marlon Pel, PA-C   Medications  ondansetron (ZOFRAN-ODT) disintegrating tablet 8 mg (8 mg Oral Given 03/19/15 2311)   ibuprofen (ADVIL,MOTRIN) tablet 800 mg (800 mg Oral Given 03/20/15 0139)  ondansetron (ZOFRAN-ODT) disintegrating tablet 4 mg (4 mg Oral Given 03/20/15 0140)    23 y.o.Mallory Johnson's evaluation in the Emergency Department is complete. It has been determined that no acute conditions requiring further emergency intervention are present at this time. The patient/guardian have been advised of the diagnosis and plan. We have discussed signs and symptoms that warrant return to the ED, such as changes or worsening in symptoms.  Vital signs are stable at discharge. Filed Vitals:   03/20/15 0126  BP: 111/75  Pulse: 63  Temp:   Resp: 16    Patient/guardian has voiced understanding and agreed to follow-up with the PCP or specialist.       Marlon Pel, PA-C 03/26/15 1610  Dione Booze, MD 04/02/15 [phone]

## 2015-03-20 NOTE — ED Notes (Signed)
Pt c/o nausea and requesting [place] sandwich.

## 2015-03-20 NOTE — Discharge Instructions (Signed)

## 2015-11-04 IMAGING — CR DG CHEST 2V
2 series · 2 of 2 positions shown · non-contrast
Comparison: None.

CLINICAL DATA: Acute onset of chest pain and soreness. Chronic
productive cough for several months. Initial encounter.

EXAM:
CHEST  2 VIEW

[chest pa]
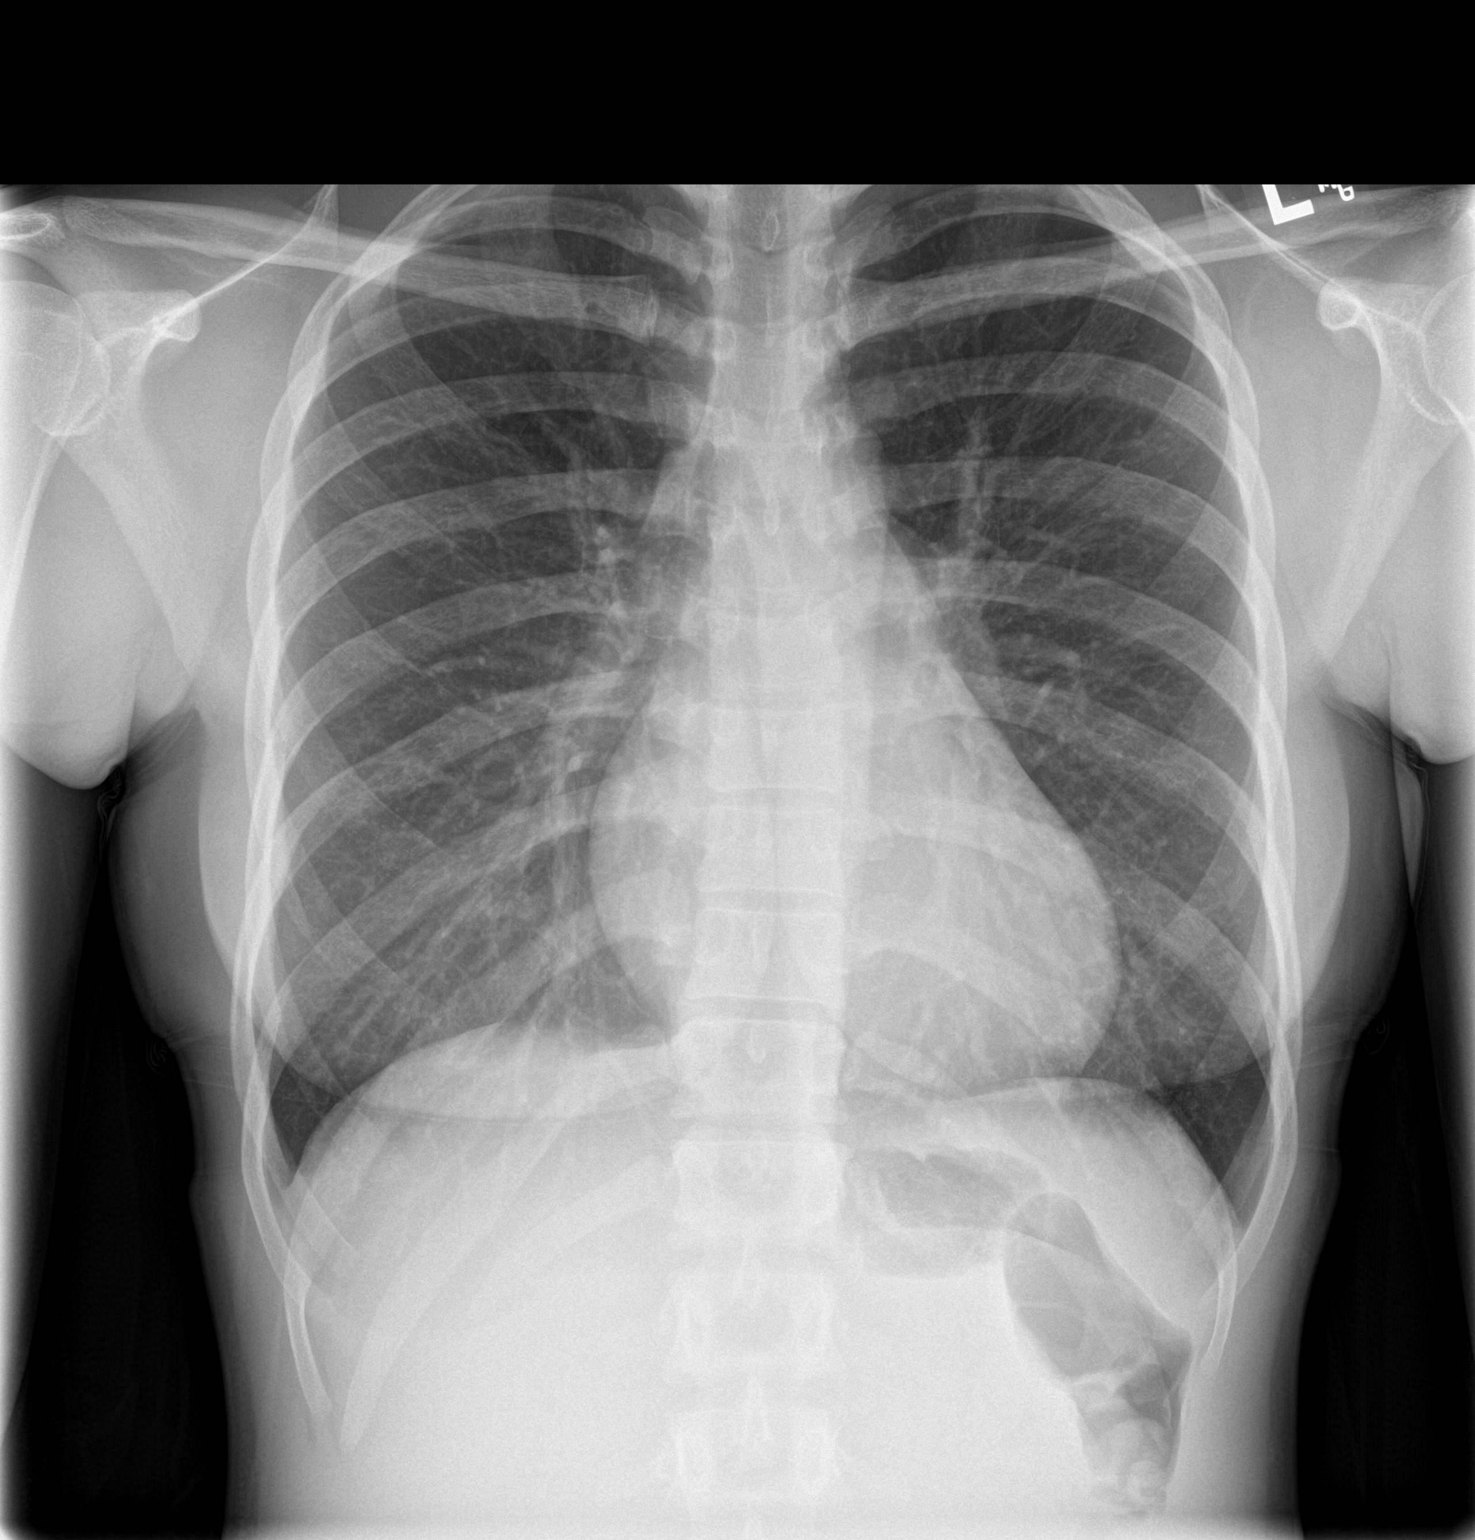

[chest lat]
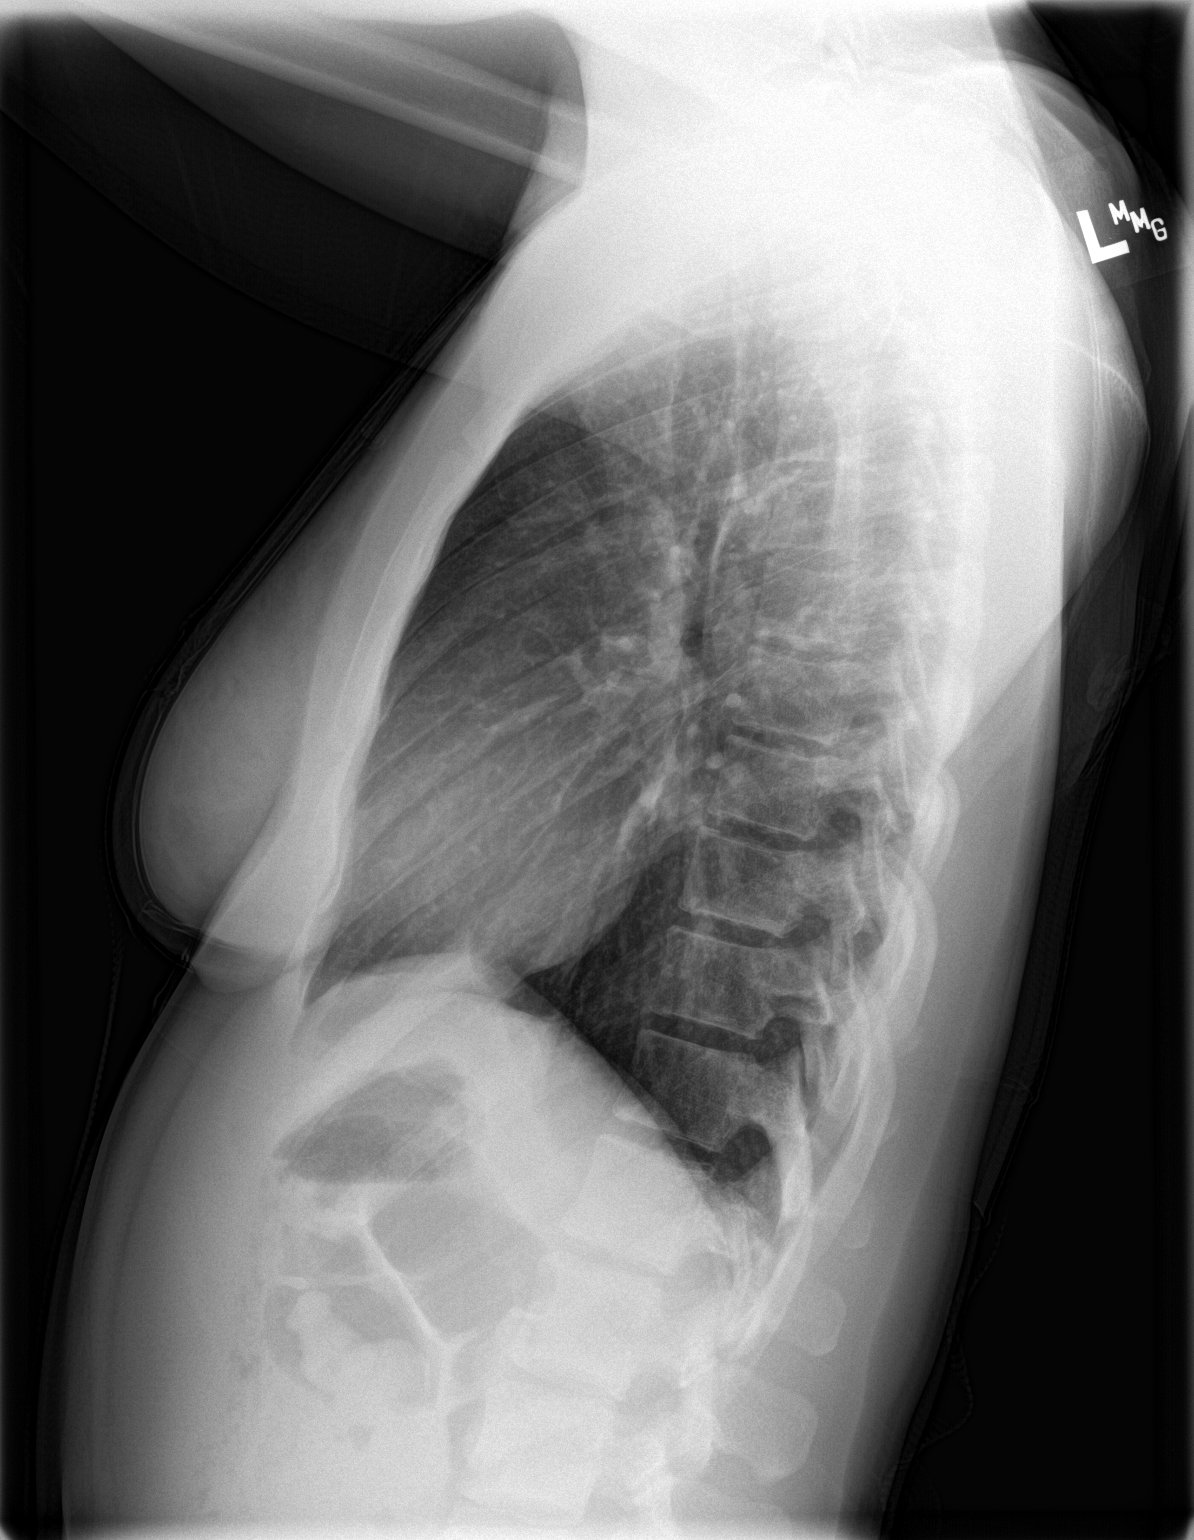

[2 of 2 positions shown; findings below may reference images not displayed]

FINDINGS: The lungs are well-aerated and clear. There is no evidence of focal
opacification, pleural effusion or pneumothorax.

The heart is normal in size; the mediastinal contour is within
normal limits. No acute osseous abnormalities are seen.
IMPRESSION: No acute cardiopulmonary process seen.
